# Patient Record
Sex: Female | Born: 1965 | Race: White | Hispanic: No | Marital: Married | Smoking: Never smoker
Health system: Southern US, Community
[De-identification: ages and names within clinical notes are randomized; demographics above are authoritative.]

## PROBLEM LIST (undated history)

## (undated) DIAGNOSIS — E119 Type 2 diabetes mellitus without complications: Secondary | ICD-10-CM

## (undated) DIAGNOSIS — E041 Nontoxic single thyroid nodule: Secondary | ICD-10-CM

## (undated) DIAGNOSIS — R002 Palpitations: Secondary | ICD-10-CM

## (undated) DIAGNOSIS — E039 Hypothyroidism, unspecified: Secondary | ICD-10-CM

## (undated) DIAGNOSIS — E063 Autoimmune thyroiditis: Secondary | ICD-10-CM

## (undated) DIAGNOSIS — N762 Acute vulvitis: Secondary | ICD-10-CM

## (undated) DIAGNOSIS — R87610 Atypical squamous cells of undetermined significance on cytologic smear of cervix (ASC-US): Secondary | ICD-10-CM

## (undated) DIAGNOSIS — B009 Herpesviral infection, unspecified: Secondary | ICD-10-CM

## (undated) DIAGNOSIS — F329 Major depressive disorder, single episode, unspecified: Secondary | ICD-10-CM

## (undated) DIAGNOSIS — Z800 Family history of malignant neoplasm of digestive organs: Secondary | ICD-10-CM

## (undated) DIAGNOSIS — N898 Other specified noninflammatory disorders of vagina: Secondary | ICD-10-CM

## (undated) DIAGNOSIS — R112 Nausea with vomiting, unspecified: Secondary | ICD-10-CM

## (undated) DIAGNOSIS — R011 Cardiac murmur, unspecified: Secondary | ICD-10-CM

## (undated) DIAGNOSIS — F32A Depression, unspecified: Secondary | ICD-10-CM

## (undated) DIAGNOSIS — Z8249 Family history of ischemic heart disease and other diseases of the circulatory system: Secondary | ICD-10-CM

## (undated) DIAGNOSIS — Z8042 Family history of malignant neoplasm of prostate: Secondary | ICD-10-CM

## (undated) DIAGNOSIS — F419 Anxiety disorder, unspecified: Secondary | ICD-10-CM

## (undated) DIAGNOSIS — Z8041 Family history of malignant neoplasm of ovary: Secondary | ICD-10-CM

## (undated) DIAGNOSIS — I10 Essential (primary) hypertension: Secondary | ICD-10-CM

## (undated) DIAGNOSIS — Z803 Family history of malignant neoplasm of breast: Secondary | ICD-10-CM

## (undated) DIAGNOSIS — E785 Hyperlipidemia, unspecified: Secondary | ICD-10-CM

## (undated) HISTORY — DX: Other specified noninflammatory disorders of vagina: N89.8

## (undated) HISTORY — DX: Palpitations: R00.2

## (undated) HISTORY — DX: Autoimmune thyroiditis: E06.3

## (undated) HISTORY — DX: Anxiety disorder, unspecified: F41.9

## (undated) HISTORY — DX: Major depressive disorder, single episode, unspecified: F32.9

## (undated) HISTORY — DX: Type 2 diabetes mellitus without complications: E11.9

## (undated) HISTORY — DX: Family history of malignant neoplasm of prostate: Z80.42

## (undated) HISTORY — DX: Cardiac murmur, unspecified: R01.1

## (undated) HISTORY — DX: Hyperlipidemia, unspecified: E78.5

## (undated) HISTORY — DX: Acute vulvitis: N76.2

## (undated) HISTORY — DX: Atypical squamous cells of undetermined significance on cytologic smear of cervix (ASC-US): R87.610

## (undated) HISTORY — DX: Herpesviral infection, unspecified: B00.9

## (undated) HISTORY — DX: Nontoxic single thyroid nodule: E04.1

## (undated) HISTORY — DX: Family history of ischemic heart disease and other diseases of the circulatory system: Z82.49

## (undated) HISTORY — DX: Family history of malignant neoplasm of digestive organs: Z80.0

## (undated) HISTORY — DX: Depression, unspecified: F32.A

## (undated) HISTORY — PX: AUGMENTATION MAMMAPLASTY: SUR837

## (undated) HISTORY — DX: Family history of malignant neoplasm of breast: Z80.3

## (undated) HISTORY — DX: Family history of malignant neoplasm of ovary: Z80.41

---

## 1998-08-05 ENCOUNTER — Other Ambulatory Visit: Admission: RE | Admit: 1998-08-05 | Discharge: 1998-08-05 | Payer: Self-pay | Admitting: Obstetrics and Gynecology

## 2000-08-08 ENCOUNTER — Other Ambulatory Visit: Admission: RE | Admit: 2000-08-08 | Discharge: 2000-08-08 | Payer: Self-pay | Admitting: Obstetrics and Gynecology

## 2000-08-09 ENCOUNTER — Encounter: Payer: Self-pay | Admitting: Obstetrics and Gynecology

## 2000-08-09 ENCOUNTER — Encounter: Admission: RE | Admit: 2000-08-09 | Discharge: 2000-08-09 | Payer: Self-pay | Admitting: Obstetrics and Gynecology

## 2003-04-01 ENCOUNTER — Other Ambulatory Visit: Admission: RE | Admit: 2003-04-01 | Discharge: 2003-04-01 | Payer: Self-pay | Admitting: Obstetrics and Gynecology

## 2003-06-12 ENCOUNTER — Encounter (INDEPENDENT_AMBULATORY_CARE_PROVIDER_SITE_OTHER): Payer: Self-pay | Admitting: Specialist

## 2003-06-12 ENCOUNTER — Ambulatory Visit (HOSPITAL_BASED_OUTPATIENT_CLINIC_OR_DEPARTMENT_OTHER): Admission: RE | Admit: 2003-06-12 | Discharge: 2003-06-12 | Payer: Self-pay | Admitting: Plastic Surgery

## 2003-06-12 ENCOUNTER — Ambulatory Visit (HOSPITAL_COMMUNITY): Admission: RE | Admit: 2003-06-12 | Discharge: 2003-06-12 | Payer: Self-pay | Admitting: Plastic Surgery

## 2004-09-09 ENCOUNTER — Encounter: Admission: RE | Admit: 2004-09-09 | Discharge: 2004-09-09 | Payer: Self-pay | Admitting: Obstetrics and Gynecology

## 2004-09-23 ENCOUNTER — Encounter: Admission: RE | Admit: 2004-09-23 | Discharge: 2004-09-23 | Payer: Self-pay | Admitting: Obstetrics and Gynecology

## 2005-03-22 ENCOUNTER — Encounter: Admission: RE | Admit: 2005-03-22 | Discharge: 2005-03-22 | Payer: Self-pay | Admitting: Obstetrics and Gynecology

## 2005-09-14 ENCOUNTER — Encounter: Admission: RE | Admit: 2005-09-14 | Discharge: 2005-09-14 | Payer: Self-pay | Admitting: Obstetrics and Gynecology

## 2006-08-07 ENCOUNTER — Encounter: Admission: RE | Admit: 2006-08-07 | Discharge: 2006-11-05 | Payer: Self-pay | Admitting: Obstetrics and Gynecology

## 2006-10-11 ENCOUNTER — Inpatient Hospital Stay (HOSPITAL_COMMUNITY): Admission: AD | Admit: 2006-10-11 | Discharge: 2006-10-13 | Payer: Self-pay | Admitting: Obstetrics and Gynecology

## 2006-10-15 ENCOUNTER — Encounter: Admission: RE | Admit: 2006-10-15 | Discharge: 2006-10-23 | Payer: Self-pay | Admitting: Obstetrics and Gynecology

## 2007-08-30 ENCOUNTER — Encounter: Admission: RE | Admit: 2007-08-30 | Discharge: 2007-08-30 | Payer: Self-pay | Admitting: Obstetrics and Gynecology

## 2008-03-26 ENCOUNTER — Encounter: Admission: RE | Admit: 2008-03-26 | Discharge: 2008-03-26 | Payer: Self-pay | Admitting: Endocrinology

## 2008-08-12 ENCOUNTER — Encounter: Admission: RE | Admit: 2008-08-12 | Discharge: 2008-08-12 | Payer: Self-pay | Admitting: Endocrinology

## 2008-09-10 ENCOUNTER — Encounter: Admission: RE | Admit: 2008-09-10 | Discharge: 2008-09-10 | Payer: Self-pay | Admitting: Family Medicine

## 2009-04-07 ENCOUNTER — Encounter: Admission: RE | Admit: 2009-04-07 | Discharge: 2009-04-07 | Payer: Self-pay | Admitting: Endocrinology

## 2009-09-23 ENCOUNTER — Encounter: Admission: RE | Admit: 2009-09-23 | Discharge: 2009-09-23 | Payer: Self-pay | Admitting: Endocrinology

## 2009-11-24 ENCOUNTER — Encounter: Admission: RE | Admit: 2009-11-24 | Discharge: 2009-11-24 | Payer: Self-pay | Admitting: Obstetrics and Gynecology

## 2010-07-11 ENCOUNTER — Encounter: Payer: Self-pay | Admitting: Obstetrics and Gynecology

## 2010-08-11 ENCOUNTER — Other Ambulatory Visit: Payer: Self-pay | Admitting: Endocrinology

## 2010-08-12 ENCOUNTER — Other Ambulatory Visit: Payer: Self-pay | Admitting: Endocrinology

## 2010-08-13 ENCOUNTER — Other Ambulatory Visit: Payer: Self-pay | Admitting: Endocrinology

## 2010-08-13 DIAGNOSIS — E049 Nontoxic goiter, unspecified: Secondary | ICD-10-CM

## 2010-08-18 ENCOUNTER — Ambulatory Visit
Admission: RE | Admit: 2010-08-18 | Discharge: 2010-08-18 | Disposition: A | Discharge status: Home / Self Care | Payer: BC Managed Care – PPO | Source: Ambulatory Visit | Attending: Endocrinology | Admitting: Endocrinology

## 2010-08-18 DIAGNOSIS — E049 Nontoxic goiter, unspecified: Secondary | ICD-10-CM

## 2010-11-05 NOTE — Op Note (Signed)
Laura Marshall, Laura Marshall                           ACCOUNT NO.:  [id number]   MEDICAL RECORD NO.:  [id number]                   PATIENT TYPE:  AMB   LOCATION:  DSC                                  FACILITY:  [facility]   PHYSICIAN:  Etter Sjogren, M.D.                  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   POSTOPERATIVE DIAGNOSES:  1. Possible early evolving melanoma of the anterior chest, greater than 1     cm.  2. Complicated open wound of the anterior chest greater than 2.5 cm.   PROCEDURE:  1. Excision of lesion of undetermined behavior, possibly early evolving at     greater than 1.0 cm, chest.  2. Complex wound closure anterior chest greater than 2.5 cm.   SURGEON:  Etter Sjogren, M.D.   ANESTHESIA:  1% Xylocaine with epinephrine plus bicarbonate.   INDICATIONS FOR PROCEDURE:  The patient is a clinical 45 year old woman who  had a lesion  on her anterior chest, mid sternal region, excised by her  dermatologist. This proved to be severely dysplastic, probable evolving  melanoma. This was referred for a wider excision. The nature of the  procedure and the risks were discussed with her and she understood the  procedure and wished to proceed. She understood the possibility of need for  further surgery depending upon the final pathology result. She also  understood that this is a very difficult area for scarring and that there  may things that need to be done with her scar over the long-term.   DESCRIPTION OF PROCEDURE:  The patient was placed in the supine position.  The excision was marked with generous borders around the previous biopsy  site. This was oriented in the vertical direction since it was essentially  mid sternal. She was prepped with Betadine and draped with sterile drapes.  Satisfactory local anesthesia was achieved.   The excision was performed. The specimen was tagged on its right lateral  border. Thorough irrigation and excellent hemostasis was confirmed. The  layered closure with  3-0 interrupted Monocryl for the deep sutures, 4-0 PDS interrupted inverted  subcutaneous and deep dermal sutures and a running 4-0 PDS subcuticular  suture. A dry sterile dressing was applied.   The patient tolerated the procedure well. She will be seen in recheck next  week.                                               Etter Sjogren, M.D.    DB/MEDQ  D:  06/12/2003  T:  06/14/2003  Job:  [id number] 

## 2010-11-05 NOTE — H&P (Signed)
NAMEMARIE, Laura Marshall             ACCOUNT NO.:  [id number]   MEDICAL RECORD NO.:  [id number]          PATIENT TYPE:  INP   LOCATION:  9174                          FACILITY:  [facility]   PHYSICIAN:  Lenoard Aden, M.D.DATE OF BIRTH:  1966/05/21   DATE OF ADMISSION:  10/11/2006  DATE OF DISCHARGE:                              HISTORY & PHYSICAL   INDICATIONS FOR INDUCTION:  Class A2 diabetes mellitus, presumed large-  for-gestational age fetus.  GBS positive for induction.  She is a 45-  year-old white female, G2, P1, at [redacted] weeks gestation for aforementioned  reasons for induction.  She has a history of an abnormal triple screen  with a risk of Down's syndrome of 1 in 36 and declined amniocentesis.  She is a nonsmoker, nondrinker.  She denies domestic or physical  violence.   Medications include glyburide and prenatal vitamins.   FAMILY HISTORY:  Remarkable for leiomyoma sarcoma, uterine cancer,  Down's syndrome, heart disease, myocardial infarction, stomach cancer,  diabetes, ovarian cancer.   Her personal history was remarkable for HPV, breast augmentation, and  Hashimoto's thyroiditis.  Pregnancy course otherwise uncomplicated.  She  is a T4, P2 with a history of vaginal delivery as noted and history of  SAB with D&C x1.   PHYSICAL EXAMINATION:  GENERAL:  She is a well-developed and well-  nourished white female in no acute distress.  HEENT:  Normal.  LUNGS:  Clear.  HEART:  Regular rhythm.  ABDOMEN:  Soft, gravid, nontender.  Estimated fetal weight 7-1/2 pounds.  PELVIC:  Cervix is 4 cm, 80% vertex, 0 station.  EXTREMITIES:  No cords.  NEUROLOGIC:  Nonfocal.  SKIN:  Intact.   NST is reactive.   IMPRESSION:  1. A 38-week obstetrics.  2. Class A2 diabetes mellitus, for induction.   PLAN:  Proceed with Pitocin for cervical ripening and induction.  GBS  prophylaxis ordered.  Epidural as needed.     Lenoard Aden, M.D.  Electronically Signed    RJT/MEDQ  D:   10/11/2006  T:  10/12/2006  Job:  [id number]

## 2010-11-23 ENCOUNTER — Other Ambulatory Visit: Payer: Self-pay | Admitting: Obstetrics and Gynecology

## 2010-11-23 DIAGNOSIS — Z1231 Encounter for screening mammogram for malignant neoplasm of breast: Secondary | ICD-10-CM

## 2010-12-02 ENCOUNTER — Ambulatory Visit: Payer: BC Managed Care – PPO

## 2010-12-15 ENCOUNTER — Ambulatory Visit
Admission: RE | Admit: 2010-12-15 | Discharge: 2010-12-15 | Disposition: A | Discharge status: Home / Self Care | Payer: BC Managed Care – PPO | Source: Ambulatory Visit | Attending: Obstetrics and Gynecology | Admitting: Obstetrics and Gynecology

## 2010-12-15 DIAGNOSIS — Z1231 Encounter for screening mammogram for malignant neoplasm of breast: Secondary | ICD-10-CM

## 2011-04-05 IMAGING — US US SOFT TISSUE HEAD/NECK
1 series · 14 of 25 positions shown · non-contrast
Comparison: 09/23/2009

CLINICAL DATA: Goiter

THYROID ULTRASOUND
TECHNIQUE: Ultrasound examination of the thyroid gland and
adjacent soft tissues was performed.

[Series 1: us soft tissue head/neck · 0.07mm/px · 14 of 33 slices shown]
[im 1/33]
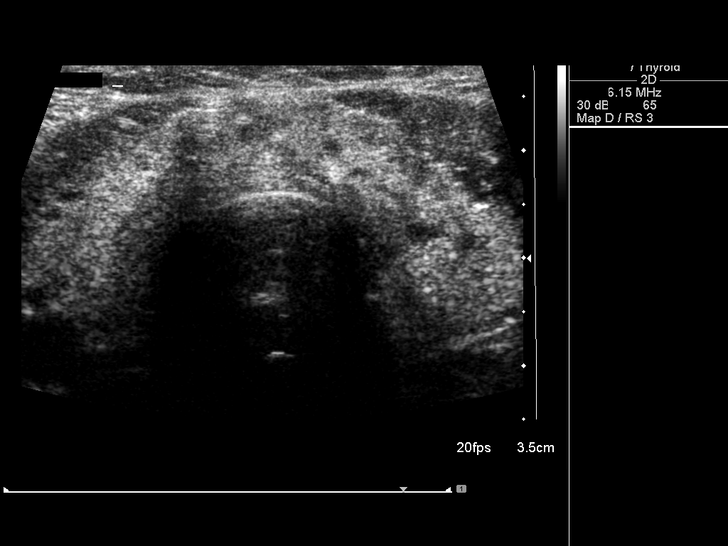
[im 3/33]
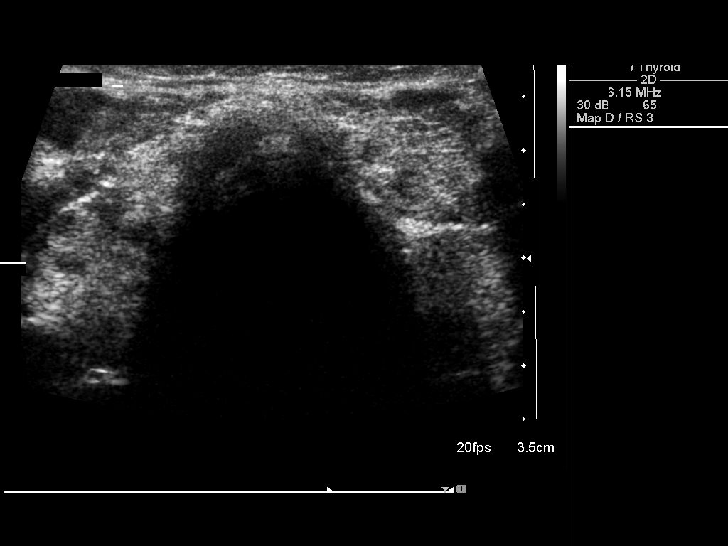
[im 6/33]
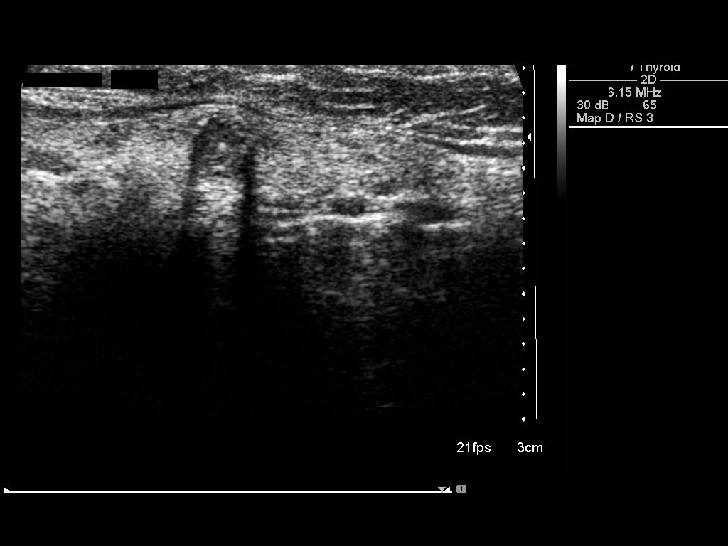
[im 9/33]
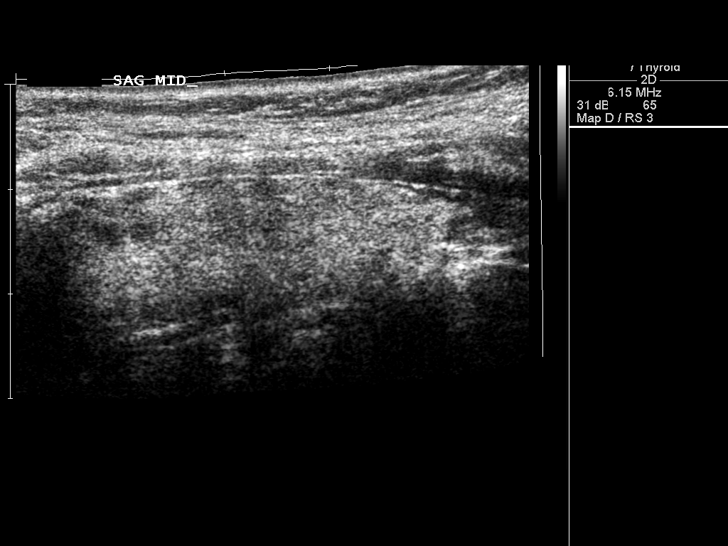
[im 11/33]
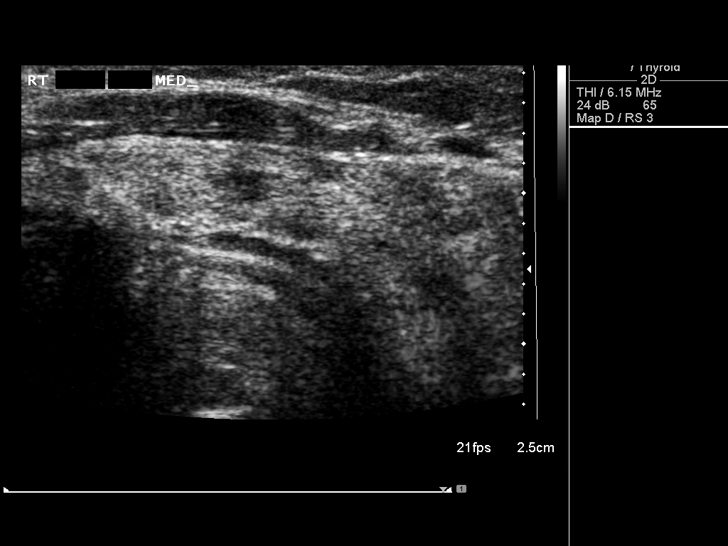
[im 13/33]
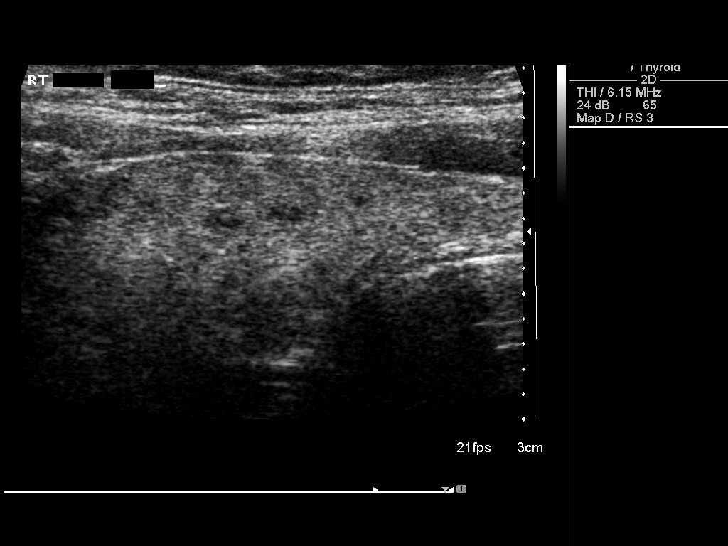
[im 15/33]
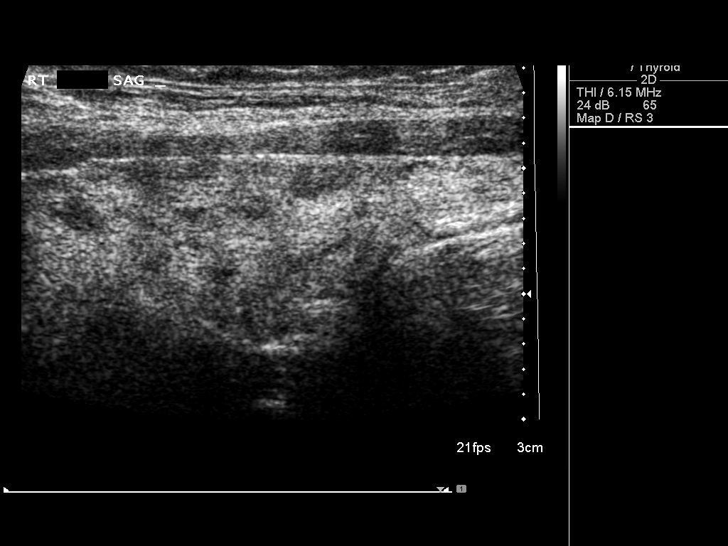
[im 18/33]
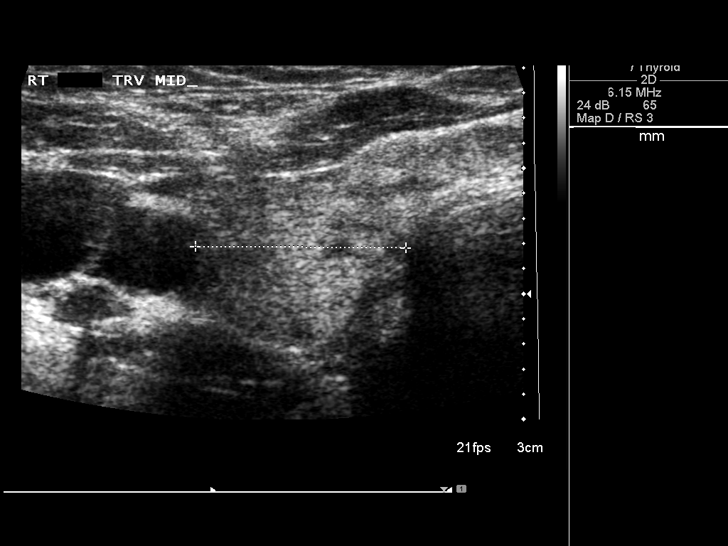
[im 21/33]
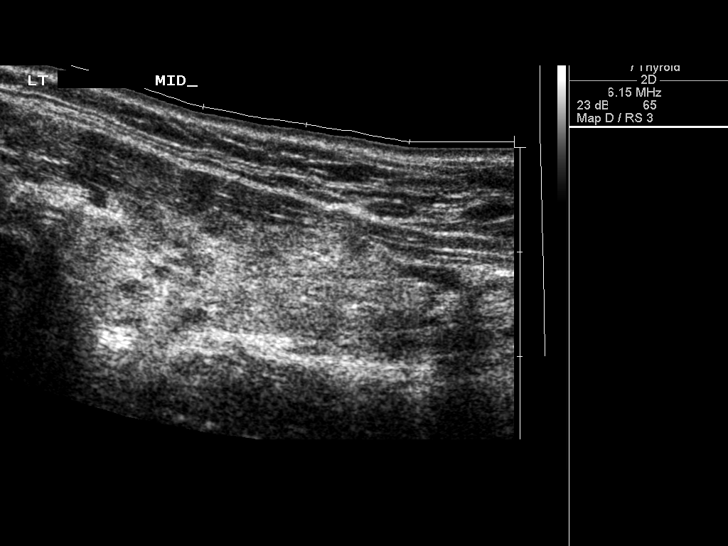
[im 22/33]
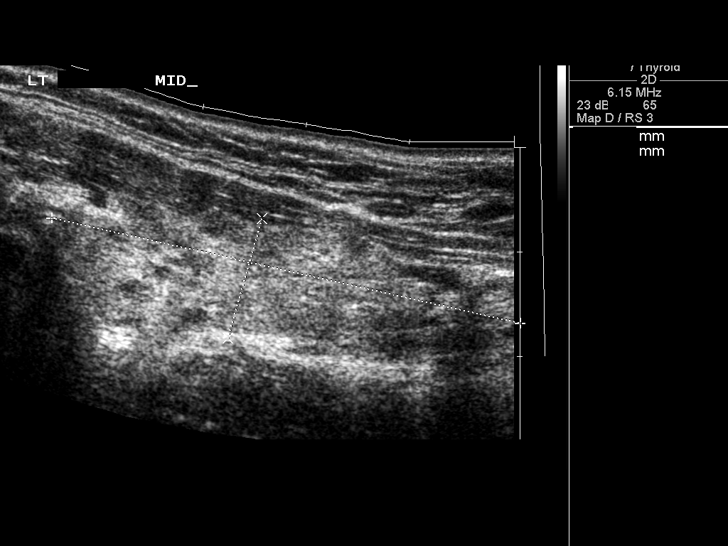
[im 25/33]
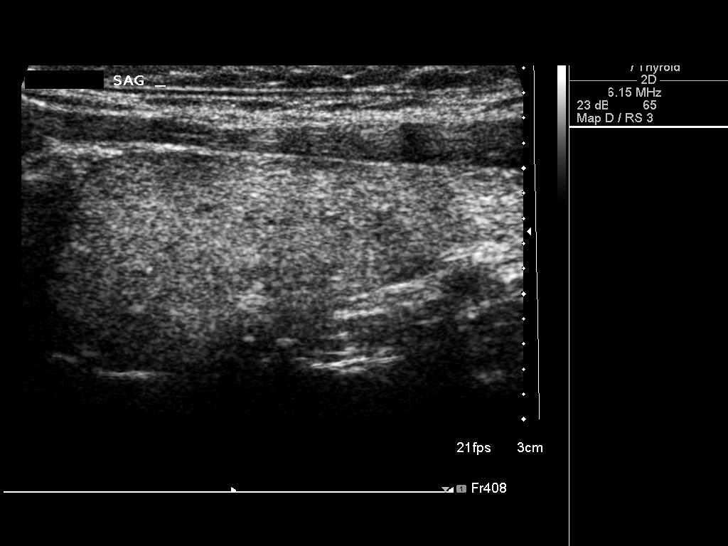
[im 27/33]
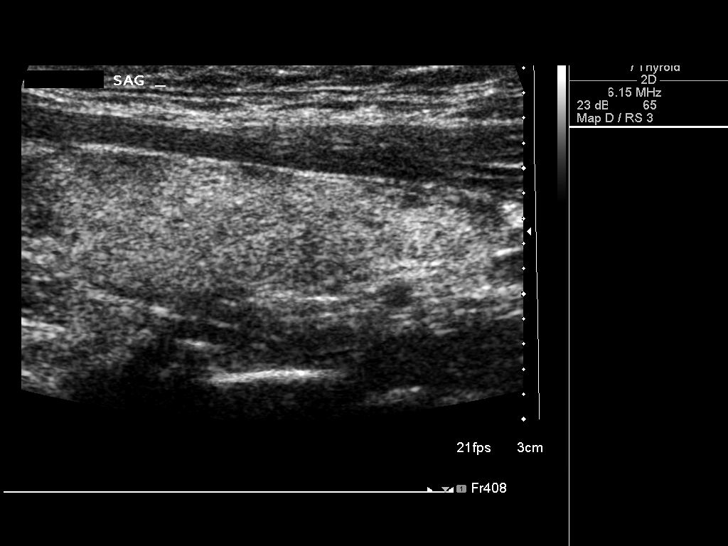
[im 30/33]
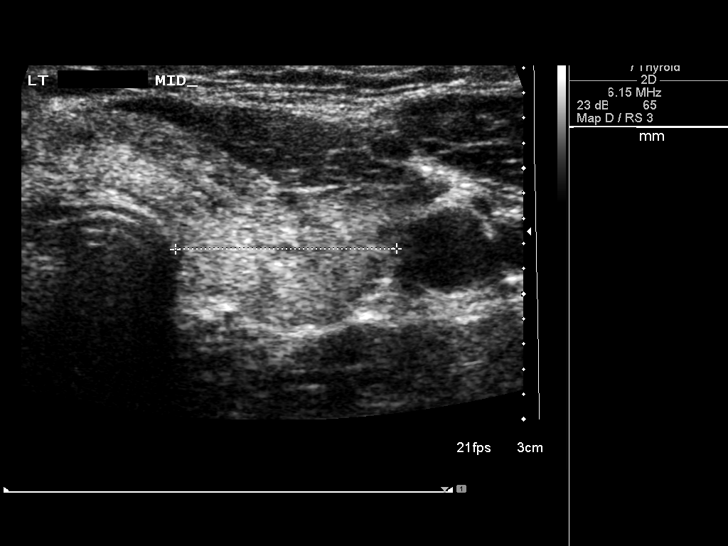
[im 33/33]
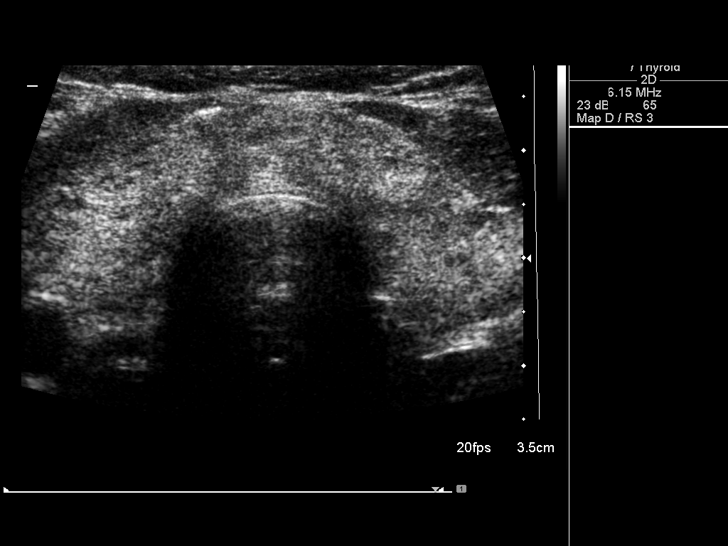

[14 of 25 positions shown; findings below may reference images not displayed]

FINDINGS: The right thyroid lobe measures 4.7 x 1.5 x 1.7 cm.  The left lobe
measures 4.6 x 1.2 x 1.8 cm.  The isthmus is 0.7 mm in thickness.
Thyroid parenchyma is diffusely heterogeneous.

5 x 5 x 7 mm solid nodule is identified in the right aspect of the
isthmus.  This contains an internal calcification.  The same nodule
measured 5 x 6 x 7 mm previously.
IMPRESSION: No interval change in the 7 mm nodule in the isthmus.

## 2011-11-21 ENCOUNTER — Other Ambulatory Visit: Payer: Self-pay | Admitting: Obstetrics and Gynecology

## 2011-11-21 DIAGNOSIS — Z1231 Encounter for screening mammogram for malignant neoplasm of breast: Secondary | ICD-10-CM

## 2011-12-27 ENCOUNTER — Ambulatory Visit
Admission: RE | Admit: 2011-12-27 | Discharge: 2011-12-27 | Disposition: A | Discharge status: Home / Self Care | Payer: BC Managed Care – PPO | Source: Ambulatory Visit | Attending: Obstetrics and Gynecology | Admitting: Obstetrics and Gynecology

## 2011-12-27 DIAGNOSIS — Z1231 Encounter for screening mammogram for malignant neoplasm of breast: Secondary | ICD-10-CM

## 2012-02-29 ENCOUNTER — Other Ambulatory Visit: Payer: Self-pay | Admitting: Family Medicine

## 2012-02-29 DIAGNOSIS — Z9882 Breast implant status: Secondary | ICD-10-CM

## 2012-02-29 DIAGNOSIS — N644 Mastodynia: Secondary | ICD-10-CM

## 2012-03-01 ENCOUNTER — Ambulatory Visit
Admission: RE | Admit: 2012-03-01 | Discharge: 2012-03-01 | Disposition: A | Discharge status: Home / Self Care | Payer: BC Managed Care – PPO | Source: Ambulatory Visit | Attending: Family Medicine | Admitting: Family Medicine

## 2012-03-01 ENCOUNTER — Other Ambulatory Visit: Payer: Self-pay | Admitting: Family Medicine

## 2012-03-01 DIAGNOSIS — Z9882 Breast implant status: Secondary | ICD-10-CM

## 2012-03-01 DIAGNOSIS — N644 Mastodynia: Secondary | ICD-10-CM

## 2012-04-23 ENCOUNTER — Other Ambulatory Visit: Payer: Self-pay | Admitting: Endocrinology

## 2012-04-23 DIAGNOSIS — E049 Nontoxic goiter, unspecified: Secondary | ICD-10-CM

## 2012-05-02 ENCOUNTER — Ambulatory Visit
Admission: RE | Admit: 2012-05-02 | Discharge: 2012-05-02 | Disposition: A | Discharge status: Home / Self Care | Payer: BC Managed Care – PPO | Source: Ambulatory Visit | Attending: Endocrinology | Admitting: Endocrinology

## 2012-05-02 DIAGNOSIS — E049 Nontoxic goiter, unspecified: Secondary | ICD-10-CM

## 2012-10-22 ENCOUNTER — Other Ambulatory Visit: Payer: Self-pay | Admitting: Endocrinology

## 2012-10-22 DIAGNOSIS — E049 Nontoxic goiter, unspecified: Secondary | ICD-10-CM

## 2012-11-05 ENCOUNTER — Ambulatory Visit
Admission: RE | Admit: 2012-11-05 | Discharge: 2012-11-05 | Disposition: A | Discharge status: Home / Self Care | Payer: BC Managed Care – PPO | Source: Ambulatory Visit | Attending: Endocrinology | Admitting: Endocrinology

## 2012-11-05 DIAGNOSIS — E049 Nontoxic goiter, unspecified: Secondary | ICD-10-CM

## 2013-01-07 ENCOUNTER — Other Ambulatory Visit: Payer: Self-pay

## 2013-01-07 DIAGNOSIS — Z1231 Encounter for screening mammogram for malignant neoplasm of breast: Secondary | ICD-10-CM

## 2013-04-12 ENCOUNTER — Ambulatory Visit
Admission: RE | Admit: 2013-04-12 | Discharge: 2013-04-12 | Disposition: A | Discharge status: Home / Self Care | Payer: BC Managed Care – PPO | Source: Ambulatory Visit

## 2013-04-12 DIAGNOSIS — Z1231 Encounter for screening mammogram for malignant neoplasm of breast: Secondary | ICD-10-CM

## 2013-07-20 ENCOUNTER — Encounter: Payer: Self-pay | Admitting: *Deleted

## 2013-07-20 ENCOUNTER — Encounter: Payer: Self-pay | Admitting: Cardiology

## 2013-07-20 DIAGNOSIS — F329 Major depressive disorder, single episode, unspecified: Secondary | ICD-10-CM | POA: Insufficient documentation

## 2013-07-20 DIAGNOSIS — F419 Anxiety disorder, unspecified: Secondary | ICD-10-CM | POA: Insufficient documentation

## 2013-07-20 DIAGNOSIS — E119 Type 2 diabetes mellitus without complications: Secondary | ICD-10-CM | POA: Insufficient documentation

## 2013-07-20 DIAGNOSIS — E041 Nontoxic single thyroid nodule: Secondary | ICD-10-CM | POA: Insufficient documentation

## 2013-07-20 DIAGNOSIS — R011 Cardiac murmur, unspecified: Secondary | ICD-10-CM | POA: Insufficient documentation

## 2013-07-20 DIAGNOSIS — F32A Depression, unspecified: Secondary | ICD-10-CM | POA: Insufficient documentation

## 2013-07-20 DIAGNOSIS — E785 Hyperlipidemia, unspecified: Secondary | ICD-10-CM | POA: Insufficient documentation

## 2014-03-06 ENCOUNTER — Other Ambulatory Visit: Payer: Self-pay | Admitting: Endocrinology

## 2014-03-06 DIAGNOSIS — E049 Nontoxic goiter, unspecified: Secondary | ICD-10-CM

## 2014-03-18 ENCOUNTER — Other Ambulatory Visit: Payer: Self-pay

## 2014-03-18 DIAGNOSIS — Z1231 Encounter for screening mammogram for malignant neoplasm of breast: Secondary | ICD-10-CM

## 2014-04-15 ENCOUNTER — Ambulatory Visit
Admission: RE | Admit: 2014-04-15 | Discharge: 2014-04-15 | Disposition: A | Discharge status: Home / Self Care | Payer: BC Managed Care – PPO | Source: Ambulatory Visit

## 2014-04-15 DIAGNOSIS — Z1231 Encounter for screening mammogram for malignant neoplasm of breast: Secondary | ICD-10-CM

## 2014-05-07 ENCOUNTER — Other Ambulatory Visit: Payer: Self-pay | Admitting: Family Medicine

## 2014-05-07 ENCOUNTER — Ambulatory Visit
Admission: RE | Admit: 2014-05-07 | Discharge: 2014-05-07 | Disposition: A | Discharge status: Home / Self Care | Payer: No Typology Code available for payment source | Source: Ambulatory Visit | Attending: Family Medicine | Admitting: Family Medicine

## 2014-05-07 DIAGNOSIS — R1031 Right lower quadrant pain: Secondary | ICD-10-CM

## 2014-05-07 MED ORDER — IOHEXOL 300 MG/ML  SOLN
100.0000 mL | Freq: Once | INTRAMUSCULAR | Status: AC | PRN
  Administered 2014-05-07: 100 mL via INTRAVENOUS

## 2015-02-25 ENCOUNTER — Other Ambulatory Visit: Payer: BC Managed Care – PPO

## 2015-02-27 ENCOUNTER — Ambulatory Visit
Admission: RE | Admit: 2015-02-27 | Discharge: 2015-02-27 | Disposition: A | Discharge status: Home / Self Care | Payer: BLUE CROSS/BLUE SHIELD | Source: Ambulatory Visit | Attending: Endocrinology | Admitting: Endocrinology

## 2015-02-27 DIAGNOSIS — E049 Nontoxic goiter, unspecified: Secondary | ICD-10-CM

## 2015-03-18 ENCOUNTER — Other Ambulatory Visit: Payer: Self-pay

## 2015-03-18 DIAGNOSIS — Z1231 Encounter for screening mammogram for malignant neoplasm of breast: Secondary | ICD-10-CM

## 2015-04-17 ENCOUNTER — Ambulatory Visit
Admission: RE | Admit: 2015-04-17 | Discharge: 2015-04-17 | Disposition: A | Discharge status: Home / Self Care | Payer: BLUE CROSS/BLUE SHIELD | Source: Ambulatory Visit

## 2015-04-17 DIAGNOSIS — Z1231 Encounter for screening mammogram for malignant neoplasm of breast: Secondary | ICD-10-CM

## 2015-10-09 DIAGNOSIS — I10 Essential (primary) hypertension: Secondary | ICD-10-CM | POA: Diagnosis not present

## 2016-01-04 DIAGNOSIS — N915 Oligomenorrhea, unspecified: Secondary | ICD-10-CM | POA: Diagnosis not present

## 2016-01-04 DIAGNOSIS — Z01419 Encounter for gynecological examination (general) (routine) without abnormal findings: Secondary | ICD-10-CM | POA: Diagnosis not present

## 2016-01-04 DIAGNOSIS — B977 Papillomavirus as the cause of diseases classified elsewhere: Secondary | ICD-10-CM | POA: Diagnosis not present

## 2016-01-04 DIAGNOSIS — Z6824 Body mass index (BMI) 24.0-24.9, adult: Secondary | ICD-10-CM | POA: Diagnosis not present

## 2016-01-04 DIAGNOSIS — R87610 Atypical squamous cells of undetermined significance on cytologic smear of cervix (ASC-US): Secondary | ICD-10-CM | POA: Diagnosis not present

## 2016-01-07 DIAGNOSIS — E785 Hyperlipidemia, unspecified: Secondary | ICD-10-CM | POA: Diagnosis not present

## 2016-01-07 DIAGNOSIS — I10 Essential (primary) hypertension: Secondary | ICD-10-CM | POA: Diagnosis not present

## 2016-01-07 DIAGNOSIS — Z803 Family history of malignant neoplasm of breast: Secondary | ICD-10-CM | POA: Diagnosis not present

## 2016-01-07 DIAGNOSIS — Z119 Encounter for screening for infectious and parasitic diseases, unspecified: Secondary | ICD-10-CM | POA: Diagnosis not present

## 2016-02-03 ENCOUNTER — Encounter: Payer: Self-pay | Admitting: Genetic Counselor

## 2016-02-03 ENCOUNTER — Ambulatory Visit (HOSPITAL_BASED_OUTPATIENT_CLINIC_OR_DEPARTMENT_OTHER): Payer: BLUE CROSS/BLUE SHIELD | Admitting: Genetic Counselor

## 2016-02-03 ENCOUNTER — Other Ambulatory Visit: Payer: BLUE CROSS/BLUE SHIELD

## 2016-02-03 DIAGNOSIS — Z315 Encounter for genetic counseling: Secondary | ICD-10-CM

## 2016-02-03 DIAGNOSIS — Z800 Family history of malignant neoplasm of digestive organs: Secondary | ICD-10-CM | POA: Diagnosis not present

## 2016-02-03 DIAGNOSIS — Z803 Family history of malignant neoplasm of breast: Secondary | ICD-10-CM

## 2016-02-03 DIAGNOSIS — Z8041 Family history of malignant neoplasm of ovary: Secondary | ICD-10-CM | POA: Insufficient documentation

## 2016-02-03 DIAGNOSIS — Z8042 Family history of malignant neoplasm of prostate: Secondary | ICD-10-CM

## 2016-02-03 NOTE — Progress Notes (Signed)
REFERRING PROVIDER: Lanice Shirts, MD [address] [place], [place] [address]   Brien Few, MD  PRIMARY PROVIDER:  Kelton Pillar, MD  PRIMARY REASON FOR VISIT:  1. Family history of breast cancer   2. Family history of ovarian cancer   3. Family history of prostate cancer   4. Family history of stomach cancer   5. Family history of colon cancer      HISTORY OF PRESENT ILLNESS:   Ms. Papadopoulos, a 50 y.o. female, was seen for a [place] cancer genetics consultation at the request of Dr. Coralyn Mark due to a family history of cancer.  Ms. Glaus presents to clinic today to discuss the possibility of a hereditary predisposition to cancer, genetic testing, and to further clarify her future cancer risks, as well as potential cancer risks for family members. Ms. Marsan is a 50 y.o. female with no personal history of cancer.  Her maternal aunt was tested at [facility] in 2009 for BRCA1 and BRCA2 mutations.  She was positive for a BRCA2 mutation called 1684C>T).  To date, patient is unaware of other family members who have been tested.  CANCER HISTORY:   No history exists.     HORMONAL RISK FACTORS:  Menarche was at age 52.  First live birth at age 41.  OCP use for approximately 29 years.  Ovaries intact: yes.  Hysterectomy: no.  Menopausal status: perimenopausal.  HRT use: 0 years. Colonoscopy: yes; several benign polyps.  To be seen in 10 years.. Mammogram within the last year: yes. Number of breast biopsies: 0. Up to date with pelvic exams:  yes. Any excessive radiation exposure in the past:  no  Past Medical History:  Diagnosis Date  . Anxiety   . Depression   . Family history of breast cancer   . Family history of colon cancer   . Family history of ovarian cancer   . Family history of prostate cancer   . Family history of stomach cancer   . Heart murmur   . Hyperlipidemia   . Thyroid nodule   . Type 2 diabetes mellitus ([place])      History reviewed. No pertinent surgical history.  Social History   Social History  . Marital status: Married    Spouse name: N/A  . Number of children: N/A  . Years of education: N/A   Social History Main Topics  . Smoking status: Never Smoker  . Smokeless tobacco: Never Used  . Alcohol use 1.2 - 1.8 oz/week    2 - 3 Glasses of wine per week  . Drug use: No  . Sexual activity: Not Asked   Other Topics Concern  . None   Social History Narrative  . None     FAMILY HISTORY:  We obtained a detailed, 4-generation family history.  Significant diagnoses are listed below: Family History  Problem Relation Age of Onset  . CAD    . Cancer Mother 29    leiomyosarcoma  . Prostate cancer Father 35  . Congenital heart disease Maternal Aunt     died in surgery at 46  . Liver cancer Paternal Aunt     probably not primary cancer  . Stomach cancer Maternal Grandmother 87  . CAD Maternal Grandfather   . Breast cancer Paternal Grandmother     dx in her 46s, died at 19  . Ovarian cancer Maternal Aunt 60  . Cancer Paternal Aunt     NOS  . Diabetes Paternal  Aunt   . Breast cancer Other 29    MGMs sister  . Colon cancer Other     MGMs brother  . Breast cancer Other     MGMs mother  . Breast cancer Other 68    MGMs maternal aunt  . Breast cancer Cousin     3 of patient's mother's first cousins    The patient has two daughters ages 31 and 79 who are cancer free.  She has two brothers who are also cancer free.  The patient's mother died of a leiomyosarcoma at age 65.  She had two sisters (twins) and a brother.  One sister died at 2 during heart surgery and the other died of ovarian cancer.  This sister tested positive for a BRCA2 mutation, as did her daughter.  The brother is cancer free.  The patient's maternal grandmother died of stomach cancer.  She had a brother and sister.  The sister had breast cancer at 22 and had a daughter who had breast cancer at 19.  The brother had  colon cancer and had three daughters, two who had breast cancer and the third with multiple myeloma.  The patient's maternal great-grandmother had breast cancer as did her sister who was diagnosed at 45 and had a daughter with colon cancer.    The patient's father was diagnosed with prostate cancer at 40.  He is currently 28.  He had three sisters.  One who had liver cancer that was probably not the primary cancer, and another who had a NOS cancer.  The patient's maternal grandmother died at 24 from breast cancer.  Patient's maternal ancestors are of [place] and [place] descent, and paternal ancestors are of [place] descent. There is no reported Ashkenazi Jewish ancestry. There is no known consanguinity.  GENETIC COUNSELING ASSESSMENT: LARONDA LISBY is a 50 y.o. female with a family history of breast, ovarian, colon and prostate cancer in the face of a known family mutation which is somewhat suggestive of a hereditary cancer syndrome and predisposition to cancer. We, therefore, discussed and recommended the following at today's visit.   DISCUSSION: We discussed that about 5-10% of breast cancer and about 15-20% of ovarian cancer is due to hereditary causes, most commonly BRCA mutations.  The patient's cousin has a KFM in [place].  The patient's mother died of leiomyosarcoma, which is not a typical BRCA cancer. The patient has approximately a 25% chance of testing positive for the [facility] found in her maternal aunt.  We discussed that Ms. Twaddell has bi lineal risk, as her paternal grandmother was diagnosed with breast cancer in her 6's and her father has prostate cancer.  About 3-6% of patient's will have more than one hereditary mutation.  Because there are several cancers in the maternal side that are not typically found in BRCA families (colon cancer, multiple myeloma, gastric cancer and leiomyosarcoma) and because there is a risk coming from the paternal side of the family, we suggest undergoing genetic  testing through a panel.  We discussed several options, including utilizing [facility]'s panel.  We reviewed the characteristics, features and inheritance patterns of hereditary cancer syndromes. We also discussed genetic testing, including the appropriate family members to test, the process of testing, insurance coverage and turn-around-time for results. We discussed the implications of a negative, positive and/or variant of uncertain significant result. We recommended Ms. Acuna pursue genetic testing for the [facility] 30-gene panel. The 30-Gene Cancer Panel offered by [facility] includes sequencing and/or deletion  duplication testing of the following 30 genes: APC, ATM, BAP1, BARD1, BMPR1A, BRCA1, BRCA2, BRIP1, CDH1, CDK4, CDKN2A (p14ARF), CDKN2A (p16INK4a), CHEK2, EPCAM, GREM1, MITF, MLH1, MSH2, MSH6, MUTYH, NBN, PALB2, PMS2, POLD1, POLE, PTEN, RAD51C, RAD51D, SMAD4, STK11, and TP53.     PLAN: After considering the risks, benefits, and limitations, Ms. Ranganathan  provided informed consent to pursue genetic testing and the blood sample was sent to [facility] for analysis of the 30-gene panel. Results should be available within approximately 3-4 weeks' time, at which point they will be disclosed by telephone to Ms. Kasel, as will any additional recommendations warranted by these results. Ms. Garcia will receive a summary of her genetic counseling visit and a copy of her results once available. This information will also be available in Epic. We encouraged Ms. Vanosdol to remain in contact with cancer genetics annually so that we can continuously update the family history and inform her of any changes in cancer genetics and testing that may be of benefit for her family. Ms. Cunliffe questions were answered to her satisfaction today. Our contact information was provided should additional questions or concerns arise.  Lastly, we encouraged Ms. Gloss to remain in  contact with cancer genetics annually so that we can continuously update the family history and inform her of any changes in cancer genetics and testing that may be of benefit for this family.   Ms.  Barnard questions were answered to her satisfaction today. Our contact information was provided should additional questions or concerns arise. Thank you for the referral and allowing [place] to share in the care of your patient.   Jerick Khachatryan P. Florene Glen, [place], [facility] Certified Genetic Counselor [email or web address] phone: [phone]  The patient was seen for a total of 45 minutes in face-to-face genetic counseling.  This patient was discussed with Drs. Magrinat, Lindi Adie and/or Burr Medico who agrees with the above.    _______________________________________________________________________ For Office Staff:  Number of people involved in session: 1 Was an Intern/ student involved with case: no

## 2016-02-23 ENCOUNTER — Encounter: Payer: Self-pay | Admitting: Genetic Counselor

## 2016-02-23 ENCOUNTER — Ambulatory Visit: Payer: Self-pay | Admitting: Genetic Counselor

## 2016-02-23 ENCOUNTER — Telehealth: Payer: Self-pay | Admitting: Genetic Counselor

## 2016-02-23 DIAGNOSIS — Z800 Family history of malignant neoplasm of digestive organs: Secondary | ICD-10-CM

## 2016-02-23 DIAGNOSIS — Z8041 Family history of malignant neoplasm of ovary: Secondary | ICD-10-CM

## 2016-02-23 DIAGNOSIS — Z1379 Encounter for other screening for genetic and chromosomal anomalies: Secondary | ICD-10-CM

## 2016-02-23 DIAGNOSIS — Z803 Family history of malignant neoplasm of breast: Secondary | ICD-10-CM

## 2016-02-23 DIAGNOSIS — Z8042 Family history of malignant neoplasm of prostate: Secondary | ICD-10-CM

## 2016-02-23 NOTE — Telephone Encounter (Signed)
Revealed that she did not have the BRCA mutation that was found in her maternal aunt. Discussed that a CHEK2 VUS was identified, but that these are not clinically relevant and therefore management would not be changed.  Most VUS are reclassified in the future and most are determined to be benign.  Patient voiced her understanding.

## 2016-02-23 NOTE — Progress Notes (Signed)
HPI: Ms. Lavin was previously seen in the [facility] due to a family history of cancer, a known family mutation (KFM) in [place], and concerns regarding a hereditary predisposition to cancer. Please refer to our prior cancer genetics clinic note for more information regarding Ms. Scroggin's medical, social and family histories, and our assessment and recommendations, at the time. Ms. Barba recent genetic test results were disclosed to her, as were recommendations warranted by these results. These results and recommendations are discussed in more detail below.  FAMILY HISTORY:  We obtained a detailed, 4-generation family history.  Significant diagnoses are listed below: Family History  Problem Relation Age of Onset  . CAD    . Cancer Mother 31    leiomyosarcoma  . Prostate cancer Father 61  . Congenital heart disease Maternal Aunt     died in surgery at 60  . Liver cancer Paternal Aunt     probably not primary cancer  . Stomach cancer Maternal Grandmother 87  . CAD Maternal Grandfather   . Breast cancer Paternal Grandmother     dx in her 16s, died at 61  . Ovarian cancer Maternal Aunt 60  . Cancer Paternal Aunt     NOS  . Diabetes Paternal Aunt   . Breast cancer Other 54    MGMs sister  . Colon cancer Other     MGMs brother  . Breast cancer Other     MGMs mother  . Breast cancer Other 76    MGMs maternal aunt  . Breast cancer Cousin     3 of patient's mother's first cousins    The patient has two daughters ages 33 and 16 who are cancer free.  She has two brothers who are also cancer free.  The patient's mother died of a leiomyosarcoma at age 50.  She had two sisters (twins) and a brother.  One sister died at 52 during heart surgery and the other died of ovarian cancer.  This sister tested positive for a BRCA2 mutation, as did her daughter.  The brother is cancer free.  The patient's maternal grandmother died of stomach cancer.  She had a brother and  sister.  The sister had breast cancer at 29 and had a daughter who had breast cancer at 22.  The brother had colon cancer and had three daughters, two who had breast cancer and the third with multiple myeloma.  The patient's maternal great-grandmother had breast cancer as did her sister who was diagnosed at 6 and had a daughter with colon cancer.    GENETIC TEST RESULTS: We recommended Ms. Lasecki pursue testing for the [facility] 30-gene panel that would test for not only the familial hereditary cancer gene mutation called BRCA2, c.1684C>T, but also additional genes. Ms. Peyser test was normal and did not reveal the familial mutation. We call this result a true negative result because the cancer-causing mutation was identified in Ms. Sharp's family, and she did not inherit it.  Given this negative result, Ms. Fischbach's chances of developing BRCA-related cancers are the same as they are in the general population.    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 At this time, it is unknown if this variant is associated with increased cancer risk or if this is a normal finding, but most variants such as this get reclassified to being inconsequential. It should not be used to make medical management decisions. With time, we suspect the  lab will determine the significance of this variant, if any. If we do learn more about it, we will try to contact Ms. Huffaker to discuss it further. However, it is important to stay in touch with [place] periodically and keep the address and phone number up to date.   CANCER SCREENING RECOMMENDATIONS: This normal result is reassuring and indicates that Ms. Ebarb does not likely have an increased risk of cancer due to a mutation in one of these genes.  We, therefore, recommended  Ms. Vanstone continue to follow the cancer screening guidelines provided by her primary healthcare providers.   RECOMMENDATIONS FOR  FAMILY MEMBERS: Women in this family might be at some increased risk of developing cancer, over the general population risk, simply due to the family history of cancer. We recommended women in this family have a yearly mammogram beginning at age 45, or 35 years younger than the earliest onset of cancer, an an annual clinical breast exam, and perform monthly breast self-exams. Women in this family should also have a gynecological exam as recommended by their primary provider. All family members should have a colonoscopy by age 63.  Based on Ms. Nelles's family history, we recommended her brothers, who have approximately a 25% chance of also having the BRCA2 KFM, have genetic counseling and testing. Ms. Folk will let us know if we can be of any assistance in coordinating genetic counseling and/or testing for this family member.   FOLLOW-UP: Lastly, we discussed with Ms. Balon that cancer genetics is a rapidly advancing field and it is possible that new genetic tests will be appropriate for her and/or her family members in the future. We encouraged her to remain in contact with cancer genetics on an annual basis so we can update her personal and family histories and let her know of advances in cancer genetics that may benefit this family.   Our contact number was provided. Ms. Downum questions were answered to her satisfaction, and she knows she is welcome to call us at anytime with additional questions or concerns.   Roma Kayser, MS, [facility] Certified Genetic Counselor [email or web address]

## 2016-02-29 DIAGNOSIS — E7800 Pure hypercholesterolemia, unspecified: Secondary | ICD-10-CM | POA: Diagnosis not present

## 2016-02-29 DIAGNOSIS — E1165 Type 2 diabetes mellitus with hyperglycemia: Secondary | ICD-10-CM | POA: Diagnosis not present

## 2016-02-29 DIAGNOSIS — E049 Nontoxic goiter, unspecified: Secondary | ICD-10-CM | POA: Diagnosis not present

## 2016-03-07 DIAGNOSIS — E049 Nontoxic goiter, unspecified: Secondary | ICD-10-CM | POA: Diagnosis not present

## 2016-03-07 DIAGNOSIS — E7800 Pure hypercholesterolemia, unspecified: Secondary | ICD-10-CM | POA: Diagnosis not present

## 2016-03-07 DIAGNOSIS — E1165 Type 2 diabetes mellitus with hyperglycemia: Secondary | ICD-10-CM | POA: Diagnosis not present

## 2016-03-31 ENCOUNTER — Other Ambulatory Visit: Payer: Self-pay | Admitting: Obstetrics and Gynecology

## 2016-03-31 DIAGNOSIS — Z1231 Encounter for screening mammogram for malignant neoplasm of breast: Secondary | ICD-10-CM

## 2016-04-19 ENCOUNTER — Ambulatory Visit
Admission: RE | Admit: 2016-04-19 | Discharge: 2016-04-19 | Disposition: A | Discharge status: Home / Self Care | Payer: BLUE CROSS/BLUE SHIELD | Source: Ambulatory Visit | Attending: Obstetrics and Gynecology | Admitting: Obstetrics and Gynecology

## 2016-04-19 DIAGNOSIS — Z1231 Encounter for screening mammogram for malignant neoplasm of breast: Secondary | ICD-10-CM | POA: Diagnosis not present

## 2016-04-25 DIAGNOSIS — N950 Postmenopausal bleeding: Secondary | ICD-10-CM | POA: Diagnosis not present

## 2016-05-04 DIAGNOSIS — N950 Postmenopausal bleeding: Secondary | ICD-10-CM | POA: Diagnosis not present

## 2016-05-20 DIAGNOSIS — N952 Postmenopausal atrophic vaginitis: Secondary | ICD-10-CM | POA: Diagnosis not present

## 2016-05-20 DIAGNOSIS — N760 Acute vaginitis: Secondary | ICD-10-CM | POA: Diagnosis not present

## 2016-07-11 DIAGNOSIS — R0789 Other chest pain: Secondary | ICD-10-CM | POA: Diagnosis not present

## 2016-07-11 DIAGNOSIS — Z20828 Contact with and (suspected) exposure to other viral communicable diseases: Secondary | ICD-10-CM | POA: Diagnosis not present

## 2016-07-11 DIAGNOSIS — Z23 Encounter for immunization: Secondary | ICD-10-CM | POA: Diagnosis not present

## 2016-07-11 DIAGNOSIS — E041 Nontoxic single thyroid nodule: Secondary | ICD-10-CM | POA: Diagnosis not present

## 2016-07-11 DIAGNOSIS — Z0001 Encounter for general adult medical examination with abnormal findings: Secondary | ICD-10-CM | POA: Diagnosis not present

## 2016-07-15 ENCOUNTER — Encounter: Payer: Self-pay | Admitting: *Deleted

## 2016-07-15 NOTE — Progress Notes (Signed)
Cardiology Office Note   Date:  07/18/2016   ID:  Laura Marshall, DOB [birth year], MRN [id number]  PCP:  Levon HedgerEBBIE SCHOENHOFF, MD    No chief complaint on file. chest discomfort   Wt Readings from Last 3 Encounters:  07/18/16 137 lb (62.1 kg)       History of Present Illness: Laura RangerSally B Marshall is a 51 y.o. female  Who has a feeling in her chest with exertion that is described a "gushing of blood."  It occurs after she finishes exercising and lasts for just a few minutes. It resolves with rest. It does not occur at any other time of the day.   She continues to exercise at a high level.  No lightheadedness.  No associated SHOB or arm pain.  No neck pain.  No trouble lying flat or having to sit up.  No change in endurance.  She can feel a flutter in her chest in the morning, that feels like a skipped beat.  Does not occur with exercise or at any other time of the day.  It is very short-lived and there are no associated symptoms of lightheadedness or syncope.  SHe has had a high CRP in the past.  Negative echo in 2011.  Her father has  had CHF, CAD.  MI at 3555.  He had CABG in his late 5160s.  He has an LVAD at age 52.  She runs three miles on the treadmill and does thes 3-4 x/week.    Maternal GF had MI at age 52.   No siblings with CAD.      Past Medical History:  Diagnosis Date  . Anxiety   . Depression   . Family history of breast cancer   . Family history of colon cancer   . Family history of ovarian cancer   . Family history of prostate cancer   . Family history of stomach cancer   . Heart murmur   . Hyperlipidemia   . Thyroid nodule   . Type 2 diabetes mellitus (HCC)     History reviewed. No pertinent surgical history.   Current Outpatient Prescriptions  Medication Sig Dispense Refill  . Docusate Sodium (COLACE PO) Take by mouth as needed.    . drospirenone-ethinyl estradiol (YAZ,GIANVI,LORYNA) 3-0.02 MG tablet Take 1 tablet by mouth daily.    Marland Kitchen.  levothyroxine (SYNTHROID, LEVOTHROID) 25 MCG tablet Take 25 mcg by mouth daily before breakfast.    . Prenatal Multivit-Min-Fe-FA (PRENATAL VITAMINS PO) Take 1 tablet by mouth daily.    . Pseudoephedrine HCl (SUDAFED PO) Take by mouth as needed.    . rosuvastatin (CRESTOR) 5 MG tablet Take 5 mg by mouth daily.     No current facility-administered medications for this visit.     Allergies:   Minocin [minocycline hcl]    Social History:  The patient  reports that she has never smoked. She has never used smokeless tobacco. She reports that she drinks about 1.2 - 1.8 oz of alcohol per week . She reports that she does not use drugs.   Family History:  The patient's family history includes Breast cancer in her cousin, other, and paternal grandmother; Breast cancer (age of onset: 9338) in her other; Breast cancer (age of onset: 4050) in her other; CAD in her father and maternal grandfather; Cancer in her paternal aunt; Cancer (age of onset: 7867) in her mother; Colon cancer in her cousin and other; Congenital heart disease in her maternal aunt; Congestive Heart  Failure in her father and maternal grandfather; Diabetes in her brother, daughter, father, and paternal aunt; Diabetes type I in her daughter; Kidney Stones in her brother; Liver cancer in her paternal aunt; Meniere's disease in her brother; Other in her daughter; Ovarian cancer in her maternal aunt; Ovarian cancer (age of onset: 102) in her maternal aunt; Prostate cancer (age of onset: 76) in her father; Stomach cancer (age of onset: 28) in her maternal grandmother; Valvular heart disease in her maternal grandfather.    ROS:  Please see the history of present illness.   Otherwise, review of systems are positive for "gushing feeling".   All other systems are reviewed and negative.    PHYSICAL EXAM: VS:  BP 136/90   Pulse 82   Ht 5\' 3"  (1.6 m)   Wt 137 lb (62.1 kg)   BMI 24.27 kg/m  , BMI Body mass index is 24.27 kg/m. GEN: Well nourished, well  developed, in no acute distress  HEENT: normal  Neck: no JVD, carotid bruits, or masses Cardiac: RRR; no murmurs, rubs, or gallops,no edema  Respiratory:  clear to auscultation bilaterally, normal work of breathing GI: soft, nontender, nondistended, + BS MS: no deformity or atrophy  Skin: warm and dry, no rash Neuro:  Strength and sensation are intact Psych: euthymic mood, full affect   EKG:   The ekg ordered at [facility] demonstrates normal ECG   Recent Labs: No results found for requested labs within last 8760 hours.   Lipid Panel No results found for: CHOL, TRIG, HDL, CHOLHDL, VLDL, LDLCALC, LDLDIRECT   Other studies Reviewed: Additional studies/ records that were reviewed today with results demonstrating: normal LVEF by echo in 2011; normal EKG.   ASSESSMENT AND PLAN:  1. Chest discomfort: Atypical. Not occurring when she is active. She completes a vigorous workout multiple times a week without any difficulty. No change in her endurance. No dyspnea on exertion or lightheadedness. I asked her to follow her blood pressure more closely in the event this "gushing" feeling is related to high blood pressure. If there is any change in her symptoms with either chest discomfort or decreased endurance, she will let us know.  We would plan for exercise treadmill test if her symptoms change. She is comfortable with this plan. 2. Family history of heart disease: She is managing her risk factors well. She exercises regularly. She has had borderline diabetes which is diet controlled. She eats a healthy diet. I encouraged her to continue with both of these habits. 3. Hyperlipidemia: Continue Crestor. 4. Palpitations: Rare.  They sound benign and are likely premature beats.   Current medicines are reviewed at length with the patient today.  The patient concerns regarding her medicines were addressed.  The following changes have been made:  No change  Labs/ tests ordered today include:  No  orders of the defined types were placed in this encounter.   Recommend 150 minutes/week of aerobic exercise Low fat, low carb, high fiber diet recommended  Disposition:   FU as needed, if sx change   Signed, Lance Muss, MD  07/18/2016 9:12 AM    [facility] [address] [place], [place], [place]  [address] Phone: [phone]; Fax: [phone]

## 2016-07-18 ENCOUNTER — Ambulatory Visit (INDEPENDENT_AMBULATORY_CARE_PROVIDER_SITE_OTHER): Payer: BLUE CROSS/BLUE SHIELD | Admitting: Interventional Cardiology

## 2016-07-18 ENCOUNTER — Encounter: Payer: Self-pay | Admitting: Interventional Cardiology

## 2016-07-18 VITALS — BP 136/90 | HR 82 | Ht 63.0 in | Wt 137.0 lb

## 2016-07-18 DIAGNOSIS — R002 Palpitations: Secondary | ICD-10-CM | POA: Diagnosis not present

## 2016-07-18 DIAGNOSIS — Z8249 Family history of ischemic heart disease and other diseases of the circulatory system: Secondary | ICD-10-CM | POA: Diagnosis not present

## 2016-07-18 DIAGNOSIS — E782 Mixed hyperlipidemia: Secondary | ICD-10-CM

## 2016-07-18 DIAGNOSIS — R0789 Other chest pain: Secondary | ICD-10-CM

## 2016-07-18 NOTE — Patient Instructions (Signed)
Medication Instructions:  Same-no changes  Labwork: None  Testing/Procedures: None  Follow-Up: As needed     If you need a refill on your cardiac medications before your next appointment, please call your pharmacy.   

## 2016-07-21 DIAGNOSIS — Z86018 Personal history of other benign neoplasm: Secondary | ICD-10-CM | POA: Diagnosis not present

## 2016-07-21 DIAGNOSIS — Z85828 Personal history of other malignant neoplasm of skin: Secondary | ICD-10-CM | POA: Diagnosis not present

## 2016-07-21 DIAGNOSIS — D1800 Hemangioma unspecified site: Secondary | ICD-10-CM | POA: Diagnosis not present

## 2016-07-21 DIAGNOSIS — L814 Other melanin hyperpigmentation: Secondary | ICD-10-CM | POA: Diagnosis not present

## 2016-09-10 ENCOUNTER — Encounter (HOSPITAL_COMMUNITY): Payer: Self-pay

## 2016-10-10 DIAGNOSIS — E7800 Pure hypercholesterolemia, unspecified: Secondary | ICD-10-CM | POA: Diagnosis not present

## 2016-10-10 DIAGNOSIS — I10 Essential (primary) hypertension: Secondary | ICD-10-CM | POA: Diagnosis not present

## 2016-10-10 DIAGNOSIS — R0789 Other chest pain: Secondary | ICD-10-CM | POA: Diagnosis not present

## 2016-10-10 DIAGNOSIS — Z803 Family history of malignant neoplasm of breast: Secondary | ICD-10-CM | POA: Diagnosis not present

## 2017-01-04 ENCOUNTER — Other Ambulatory Visit: Payer: Self-pay | Admitting: Endocrinology

## 2017-01-04 DIAGNOSIS — E049 Nontoxic goiter, unspecified: Secondary | ICD-10-CM

## 2017-01-11 ENCOUNTER — Other Ambulatory Visit: Payer: BLUE CROSS/BLUE SHIELD

## 2017-02-27 DIAGNOSIS — E049 Nontoxic goiter, unspecified: Secondary | ICD-10-CM | POA: Diagnosis not present

## 2017-02-27 DIAGNOSIS — E7800 Pure hypercholesterolemia, unspecified: Secondary | ICD-10-CM | POA: Diagnosis not present

## 2017-02-27 DIAGNOSIS — E1165 Type 2 diabetes mellitus with hyperglycemia: Secondary | ICD-10-CM | POA: Diagnosis not present

## 2017-03-01 ENCOUNTER — Ambulatory Visit
Admission: RE | Admit: 2017-03-01 | Discharge: 2017-03-01 | Disposition: A | Discharge status: Home / Self Care | Payer: BLUE CROSS/BLUE SHIELD | Source: Ambulatory Visit | Attending: Endocrinology | Admitting: Endocrinology

## 2017-03-01 DIAGNOSIS — E049 Nontoxic goiter, unspecified: Secondary | ICD-10-CM

## 2017-03-01 DIAGNOSIS — E041 Nontoxic single thyroid nodule: Secondary | ICD-10-CM | POA: Diagnosis not present

## 2017-03-02 DIAGNOSIS — R300 Dysuria: Secondary | ICD-10-CM | POA: Diagnosis not present

## 2017-03-02 DIAGNOSIS — N760 Acute vaginitis: Secondary | ICD-10-CM | POA: Diagnosis not present

## 2017-03-02 DIAGNOSIS — N941 Unspecified dyspareunia: Secondary | ICD-10-CM | POA: Diagnosis not present

## 2017-03-06 DIAGNOSIS — E7800 Pure hypercholesterolemia, unspecified: Secondary | ICD-10-CM | POA: Diagnosis not present

## 2017-03-06 DIAGNOSIS — E1165 Type 2 diabetes mellitus with hyperglycemia: Secondary | ICD-10-CM | POA: Diagnosis not present

## 2017-03-06 DIAGNOSIS — E049 Nontoxic goiter, unspecified: Secondary | ICD-10-CM | POA: Diagnosis not present

## 2017-03-08 DIAGNOSIS — N952 Postmenopausal atrophic vaginitis: Secondary | ICD-10-CM | POA: Diagnosis not present

## 2017-03-22 DIAGNOSIS — Z23 Encounter for immunization: Secondary | ICD-10-CM | POA: Diagnosis not present

## 2017-03-22 DIAGNOSIS — S80819A Abrasion, unspecified lower leg, initial encounter: Secondary | ICD-10-CM | POA: Diagnosis not present

## 2017-03-24 DIAGNOSIS — Z01419 Encounter for gynecological examination (general) (routine) without abnormal findings: Secondary | ICD-10-CM | POA: Diagnosis not present

## 2017-03-24 DIAGNOSIS — Z6823 Body mass index (BMI) 23.0-23.9, adult: Secondary | ICD-10-CM | POA: Diagnosis not present

## 2017-03-24 DIAGNOSIS — R87610 Atypical squamous cells of undetermined significance on cytologic smear of cervix (ASC-US): Secondary | ICD-10-CM | POA: Diagnosis not present

## 2017-04-05 DIAGNOSIS — J0120 Acute ethmoidal sinusitis, unspecified: Secondary | ICD-10-CM | POA: Diagnosis not present

## 2017-04-05 DIAGNOSIS — R509 Fever, unspecified: Secondary | ICD-10-CM | POA: Diagnosis not present

## 2017-04-11 ENCOUNTER — Other Ambulatory Visit: Payer: Self-pay | Admitting: Obstetrics and Gynecology

## 2017-04-11 DIAGNOSIS — Z1231 Encounter for screening mammogram for malignant neoplasm of breast: Secondary | ICD-10-CM

## 2017-04-28 ENCOUNTER — Ambulatory Visit: Payer: BLUE CROSS/BLUE SHIELD

## 2017-05-17 DIAGNOSIS — J028 Acute pharyngitis due to other specified organisms: Secondary | ICD-10-CM | POA: Diagnosis not present

## 2017-05-17 DIAGNOSIS — R509 Fever, unspecified: Secondary | ICD-10-CM | POA: Diagnosis not present

## 2017-05-19 DIAGNOSIS — J029 Acute pharyngitis, unspecified: Secondary | ICD-10-CM | POA: Diagnosis not present

## 2017-05-19 DIAGNOSIS — H6693 Otitis media, unspecified, bilateral: Secondary | ICD-10-CM | POA: Diagnosis not present

## 2017-07-13 ENCOUNTER — Ambulatory Visit
Admission: RE | Admit: 2017-07-13 | Discharge: 2017-07-13 | Disposition: A | Discharge status: Home / Self Care | Payer: BLUE CROSS/BLUE SHIELD | Source: Ambulatory Visit | Attending: Obstetrics and Gynecology | Admitting: Obstetrics and Gynecology

## 2017-07-13 DIAGNOSIS — Z1231 Encounter for screening mammogram for malignant neoplasm of breast: Secondary | ICD-10-CM | POA: Diagnosis not present

## 2017-07-17 DIAGNOSIS — Z0000 Encounter for general adult medical examination without abnormal findings: Secondary | ICD-10-CM | POA: Diagnosis not present

## 2017-07-17 DIAGNOSIS — I10 Essential (primary) hypertension: Secondary | ICD-10-CM | POA: Diagnosis not present

## 2017-07-17 DIAGNOSIS — Z23 Encounter for immunization: Secondary | ICD-10-CM | POA: Diagnosis not present

## 2017-07-17 DIAGNOSIS — E7800 Pure hypercholesterolemia, unspecified: Secondary | ICD-10-CM | POA: Diagnosis not present

## 2017-07-17 DIAGNOSIS — E039 Hypothyroidism, unspecified: Secondary | ICD-10-CM | POA: Diagnosis not present

## 2017-07-20 DIAGNOSIS — E119 Type 2 diabetes mellitus without complications: Secondary | ICD-10-CM | POA: Diagnosis not present

## 2017-08-08 DIAGNOSIS — N952 Postmenopausal atrophic vaginitis: Secondary | ICD-10-CM | POA: Diagnosis not present

## 2017-09-07 DIAGNOSIS — Z86018 Personal history of other benign neoplasm: Secondary | ICD-10-CM | POA: Diagnosis not present

## 2017-09-07 DIAGNOSIS — Z85828 Personal history of other malignant neoplasm of skin: Secondary | ICD-10-CM | POA: Diagnosis not present

## 2017-09-07 DIAGNOSIS — L814 Other melanin hyperpigmentation: Secondary | ICD-10-CM | POA: Diagnosis not present

## 2017-09-07 DIAGNOSIS — D1800 Hemangioma unspecified site: Secondary | ICD-10-CM | POA: Diagnosis not present

## 2018-02-28 DIAGNOSIS — E1165 Type 2 diabetes mellitus with hyperglycemia: Secondary | ICD-10-CM | POA: Diagnosis not present

## 2018-02-28 DIAGNOSIS — E039 Hypothyroidism, unspecified: Secondary | ICD-10-CM | POA: Diagnosis not present

## 2018-02-28 DIAGNOSIS — E7800 Pure hypercholesterolemia, unspecified: Secondary | ICD-10-CM | POA: Diagnosis not present

## 2018-03-07 DIAGNOSIS — E039 Hypothyroidism, unspecified: Secondary | ICD-10-CM | POA: Diagnosis not present

## 2018-03-07 DIAGNOSIS — E7800 Pure hypercholesterolemia, unspecified: Secondary | ICD-10-CM | POA: Diagnosis not present

## 2018-03-07 DIAGNOSIS — E1165 Type 2 diabetes mellitus with hyperglycemia: Secondary | ICD-10-CM | POA: Diagnosis not present

## 2018-03-07 DIAGNOSIS — E049 Nontoxic goiter, unspecified: Secondary | ICD-10-CM | POA: Diagnosis not present

## 2018-07-05 ENCOUNTER — Other Ambulatory Visit: Payer: Self-pay | Admitting: Obstetrics and Gynecology

## 2018-07-05 DIAGNOSIS — Z1231 Encounter for screening mammogram for malignant neoplasm of breast: Secondary | ICD-10-CM

## 2018-07-18 DIAGNOSIS — Z0000 Encounter for general adult medical examination without abnormal findings: Secondary | ICD-10-CM | POA: Diagnosis not present

## 2018-07-18 DIAGNOSIS — Z23 Encounter for immunization: Secondary | ICD-10-CM | POA: Diagnosis not present

## 2018-07-18 DIAGNOSIS — E039 Hypothyroidism, unspecified: Secondary | ICD-10-CM | POA: Diagnosis not present

## 2018-07-18 DIAGNOSIS — I10 Essential (primary) hypertension: Secondary | ICD-10-CM | POA: Diagnosis not present

## 2018-08-07 ENCOUNTER — Ambulatory Visit
Admission: RE | Admit: 2018-08-07 | Discharge: 2018-08-07 | Disposition: A | Discharge status: Home / Self Care | Payer: BLUE CROSS/BLUE SHIELD | Source: Ambulatory Visit | Attending: Obstetrics and Gynecology | Admitting: Obstetrics and Gynecology

## 2018-08-07 DIAGNOSIS — Z1231 Encounter for screening mammogram for malignant neoplasm of breast: Secondary | ICD-10-CM | POA: Diagnosis not present

## 2018-12-25 DIAGNOSIS — Z6825 Body mass index (BMI) 25.0-25.9, adult: Secondary | ICD-10-CM | POA: Diagnosis not present

## 2018-12-25 DIAGNOSIS — Z124 Encounter for screening for malignant neoplasm of cervix: Secondary | ICD-10-CM | POA: Diagnosis not present

## 2018-12-25 DIAGNOSIS — Z1151 Encounter for screening for human papillomavirus (HPV): Secondary | ICD-10-CM | POA: Diagnosis not present

## 2018-12-25 DIAGNOSIS — Z01419 Encounter for gynecological examination (general) (routine) without abnormal findings: Secondary | ICD-10-CM | POA: Diagnosis not present

## 2019-03-04 DIAGNOSIS — E7800 Pure hypercholesterolemia, unspecified: Secondary | ICD-10-CM | POA: Diagnosis not present

## 2019-03-04 DIAGNOSIS — E1165 Type 2 diabetes mellitus with hyperglycemia: Secondary | ICD-10-CM | POA: Diagnosis not present

## 2019-03-04 DIAGNOSIS — E039 Hypothyroidism, unspecified: Secondary | ICD-10-CM | POA: Diagnosis not present

## 2019-03-11 DIAGNOSIS — E039 Hypothyroidism, unspecified: Secondary | ICD-10-CM | POA: Diagnosis not present

## 2019-03-11 DIAGNOSIS — E1165 Type 2 diabetes mellitus with hyperglycemia: Secondary | ICD-10-CM | POA: Diagnosis not present

## 2019-03-11 DIAGNOSIS — E7800 Pure hypercholesterolemia, unspecified: Secondary | ICD-10-CM | POA: Diagnosis not present

## 2019-03-11 DIAGNOSIS — E049 Nontoxic goiter, unspecified: Secondary | ICD-10-CM | POA: Diagnosis not present

## 2019-04-03 DIAGNOSIS — E119 Type 2 diabetes mellitus without complications: Secondary | ICD-10-CM | POA: Diagnosis not present

## 2019-05-21 DIAGNOSIS — Z23 Encounter for immunization: Secondary | ICD-10-CM | POA: Diagnosis not present

## 2019-05-21 DIAGNOSIS — W57XXXA Bitten or stung by nonvenomous insect and other nonvenomous arthropods, initial encounter: Secondary | ICD-10-CM | POA: Diagnosis not present

## 2019-05-21 DIAGNOSIS — S30860A Insect bite (nonvenomous) of lower back and pelvis, initial encounter: Secondary | ICD-10-CM | POA: Diagnosis not present

## 2019-06-05 DIAGNOSIS — E7800 Pure hypercholesterolemia, unspecified: Secondary | ICD-10-CM | POA: Diagnosis not present

## 2019-07-19 ENCOUNTER — Other Ambulatory Visit: Payer: Self-pay | Admitting: Obstetrics and Gynecology

## 2019-07-19 DIAGNOSIS — Z1231 Encounter for screening mammogram for malignant neoplasm of breast: Secondary | ICD-10-CM

## 2019-08-17 DIAGNOSIS — H6980 Other specified disorders of Eustachian tube, unspecified ear: Secondary | ICD-10-CM | POA: Diagnosis not present

## 2019-08-17 DIAGNOSIS — H6121 Impacted cerumen, right ear: Secondary | ICD-10-CM | POA: Diagnosis not present

## 2019-08-26 ENCOUNTER — Other Ambulatory Visit: Payer: Self-pay

## 2019-08-26 ENCOUNTER — Ambulatory Visit
Admission: RE | Admit: 2019-08-26 | Discharge: 2019-08-26 | Disposition: A | Discharge status: Home / Self Care | Payer: BLUE CROSS/BLUE SHIELD | Source: Ambulatory Visit | Attending: Obstetrics and Gynecology | Admitting: Obstetrics and Gynecology

## 2019-08-26 DIAGNOSIS — Z1231 Encounter for screening mammogram for malignant neoplasm of breast: Secondary | ICD-10-CM | POA: Diagnosis not present

## 2019-10-23 DIAGNOSIS — Z0000 Encounter for general adult medical examination without abnormal findings: Secondary | ICD-10-CM | POA: Diagnosis not present

## 2019-10-23 DIAGNOSIS — I10 Essential (primary) hypertension: Secondary | ICD-10-CM | POA: Diagnosis not present

## 2019-10-23 DIAGNOSIS — E7800 Pure hypercholesterolemia, unspecified: Secondary | ICD-10-CM | POA: Diagnosis not present

## 2019-11-06 ENCOUNTER — Ambulatory Visit: Payer: PRIVATE HEALTH INSURANCE | Attending: Internal Medicine

## 2019-11-11 ENCOUNTER — Ambulatory Visit: Payer: PRIVATE HEALTH INSURANCE | Attending: Internal Medicine

## 2019-11-11 DIAGNOSIS — Z20822 Contact with and (suspected) exposure to covid-19: Secondary | ICD-10-CM | POA: Diagnosis not present

## 2019-11-11 DIAGNOSIS — L821 Other seborrheic keratosis: Secondary | ICD-10-CM | POA: Diagnosis not present

## 2019-11-11 DIAGNOSIS — L814 Other melanin hyperpigmentation: Secondary | ICD-10-CM | POA: Diagnosis not present

## 2019-11-11 DIAGNOSIS — Z86018 Personal history of other benign neoplasm: Secondary | ICD-10-CM | POA: Diagnosis not present

## 2019-11-11 DIAGNOSIS — Z85828 Personal history of other malignant neoplasm of skin: Secondary | ICD-10-CM | POA: Diagnosis not present

## 2019-11-12 LAB — SARS-COV-2, NAA 2 DAY TAT

## 2019-11-12 LAB — NOVEL CORONAVIRUS, NAA: SARS-CoV-2, NAA: NOT DETECTED

## 2020-03-04 DIAGNOSIS — E1165 Type 2 diabetes mellitus with hyperglycemia: Secondary | ICD-10-CM | POA: Diagnosis not present

## 2020-03-04 DIAGNOSIS — E039 Hypothyroidism, unspecified: Secondary | ICD-10-CM | POA: Diagnosis not present

## 2020-03-04 DIAGNOSIS — E7800 Pure hypercholesterolemia, unspecified: Secondary | ICD-10-CM | POA: Diagnosis not present

## 2020-03-11 DIAGNOSIS — E1165 Type 2 diabetes mellitus with hyperglycemia: Secondary | ICD-10-CM | POA: Diagnosis not present

## 2020-03-11 DIAGNOSIS — E039 Hypothyroidism, unspecified: Secondary | ICD-10-CM | POA: Diagnosis not present

## 2020-03-11 DIAGNOSIS — E7800 Pure hypercholesterolemia, unspecified: Secondary | ICD-10-CM | POA: Diagnosis not present

## 2020-03-11 DIAGNOSIS — E049 Nontoxic goiter, unspecified: Secondary | ICD-10-CM | POA: Diagnosis not present

## 2020-03-21 DIAGNOSIS — Z20822 Contact with and (suspected) exposure to covid-19: Secondary | ICD-10-CM | POA: Diagnosis not present

## 2020-05-18 DIAGNOSIS — E7800 Pure hypercholesterolemia, unspecified: Secondary | ICD-10-CM | POA: Diagnosis not present

## 2020-07-10 DIAGNOSIS — E119 Type 2 diabetes mellitus without complications: Secondary | ICD-10-CM | POA: Diagnosis not present

## 2020-08-25 ENCOUNTER — Other Ambulatory Visit: Payer: Self-pay | Admitting: Obstetrics and Gynecology

## 2020-08-25 DIAGNOSIS — Z1231 Encounter for screening mammogram for malignant neoplasm of breast: Secondary | ICD-10-CM

## 2020-10-07 ENCOUNTER — Ambulatory Visit
Admission: RE | Admit: 2020-10-07 | Discharge: 2020-10-07 | Disposition: A | Discharge status: Home / Self Care | Payer: BC Managed Care – PPO | Source: Ambulatory Visit

## 2020-10-07 ENCOUNTER — Other Ambulatory Visit: Payer: Self-pay

## 2020-10-07 DIAGNOSIS — Z1231 Encounter for screening mammogram for malignant neoplasm of breast: Secondary | ICD-10-CM

## 2020-10-29 DIAGNOSIS — U071 COVID-19: Secondary | ICD-10-CM | POA: Diagnosis not present

## 2020-10-29 DIAGNOSIS — R300 Dysuria: Secondary | ICD-10-CM | POA: Diagnosis not present

## 2020-10-29 DIAGNOSIS — R509 Fever, unspecified: Secondary | ICD-10-CM | POA: Diagnosis not present

## 2020-10-29 DIAGNOSIS — J069 Acute upper respiratory infection, unspecified: Secondary | ICD-10-CM | POA: Diagnosis not present

## 2020-11-19 DIAGNOSIS — Z01411 Encounter for gynecological examination (general) (routine) with abnormal findings: Secondary | ICD-10-CM | POA: Diagnosis not present

## 2020-11-19 DIAGNOSIS — Z01419 Encounter for gynecological examination (general) (routine) without abnormal findings: Secondary | ICD-10-CM | POA: Diagnosis not present

## 2020-11-19 DIAGNOSIS — Z6825 Body mass index (BMI) 25.0-25.9, adult: Secondary | ICD-10-CM | POA: Diagnosis not present

## 2020-11-19 DIAGNOSIS — Z124 Encounter for screening for malignant neoplasm of cervix: Secondary | ICD-10-CM | POA: Diagnosis not present

## 2020-12-02 DIAGNOSIS — U071 COVID-19: Secondary | ICD-10-CM | POA: Diagnosis not present

## 2020-12-02 DIAGNOSIS — J040 Acute laryngitis: Secondary | ICD-10-CM | POA: Diagnosis not present

## 2020-12-02 DIAGNOSIS — R0981 Nasal congestion: Secondary | ICD-10-CM | POA: Diagnosis not present

## 2020-12-02 DIAGNOSIS — R059 Cough, unspecified: Secondary | ICD-10-CM | POA: Diagnosis not present

## 2021-03-04 DIAGNOSIS — E7801 Familial hypercholesterolemia: Secondary | ICD-10-CM | POA: Diagnosis not present

## 2021-03-04 DIAGNOSIS — E1165 Type 2 diabetes mellitus with hyperglycemia: Secondary | ICD-10-CM | POA: Diagnosis not present

## 2021-03-04 DIAGNOSIS — E039 Hypothyroidism, unspecified: Secondary | ICD-10-CM | POA: Diagnosis not present

## 2021-03-15 DIAGNOSIS — E1165 Type 2 diabetes mellitus with hyperglycemia: Secondary | ICD-10-CM | POA: Diagnosis not present

## 2021-03-15 DIAGNOSIS — E049 Nontoxic goiter, unspecified: Secondary | ICD-10-CM | POA: Diagnosis not present

## 2021-03-15 DIAGNOSIS — E039 Hypothyroidism, unspecified: Secondary | ICD-10-CM | POA: Diagnosis not present

## 2021-03-15 DIAGNOSIS — E7800 Pure hypercholesterolemia, unspecified: Secondary | ICD-10-CM | POA: Diagnosis not present

## 2021-04-13 DIAGNOSIS — N760 Acute vaginitis: Secondary | ICD-10-CM | POA: Diagnosis not present

## 2021-04-13 DIAGNOSIS — N898 Other specified noninflammatory disorders of vagina: Secondary | ICD-10-CM | POA: Diagnosis not present

## 2021-04-21 DIAGNOSIS — R102 Pelvic and perineal pain: Secondary | ICD-10-CM | POA: Diagnosis not present

## 2021-04-21 DIAGNOSIS — N9489 Other specified conditions associated with female genital organs and menstrual cycle: Secondary | ICD-10-CM | POA: Diagnosis not present

## 2021-05-25 IMAGING — MG DIGITAL SCREENING BREAST BILAT IMPLANT W/ TOMO W/ CAD
8 of 12 series · 8 of 28 positions shown · non-contrast
Comparison: Prior films

CLINICAL DATA: Screening.

EXAM:
DIGITAL SCREENING BILATERAL MAMMOGRAM WITH IMPLANTS, CAD AND
TOMOSYNTHESIS
TECHNIQUE: Bilateral screening digital craniocaudal and mediolateral oblique
mammograms were obtained. Bilateral screening digital breast
tomosynthesis was performed. The images were evaluated with
computer-aided detection. Standard and/or implant displaced views
were performed.

[L MLO]
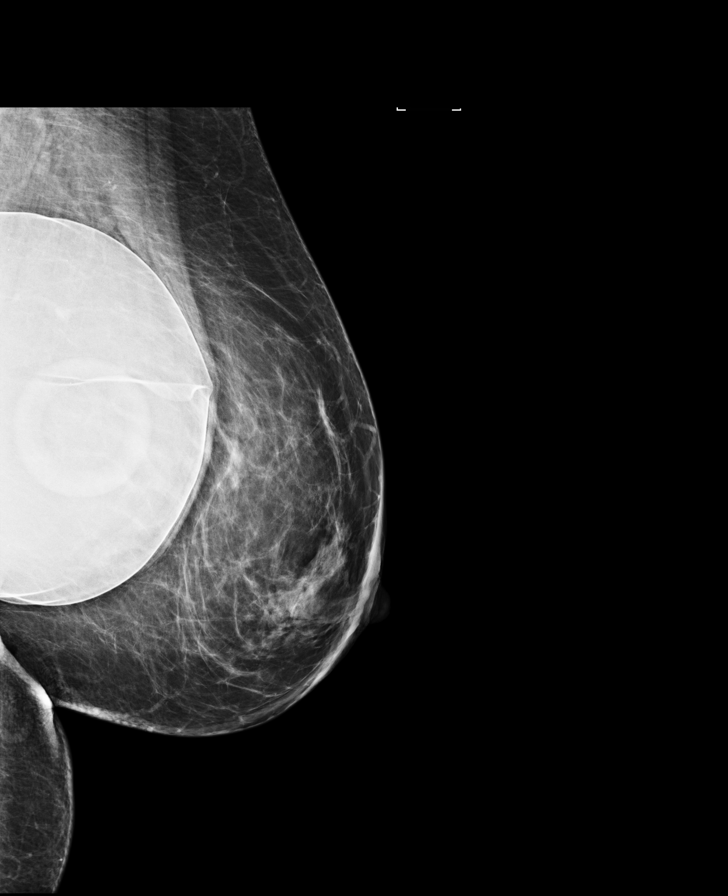

[L CC]
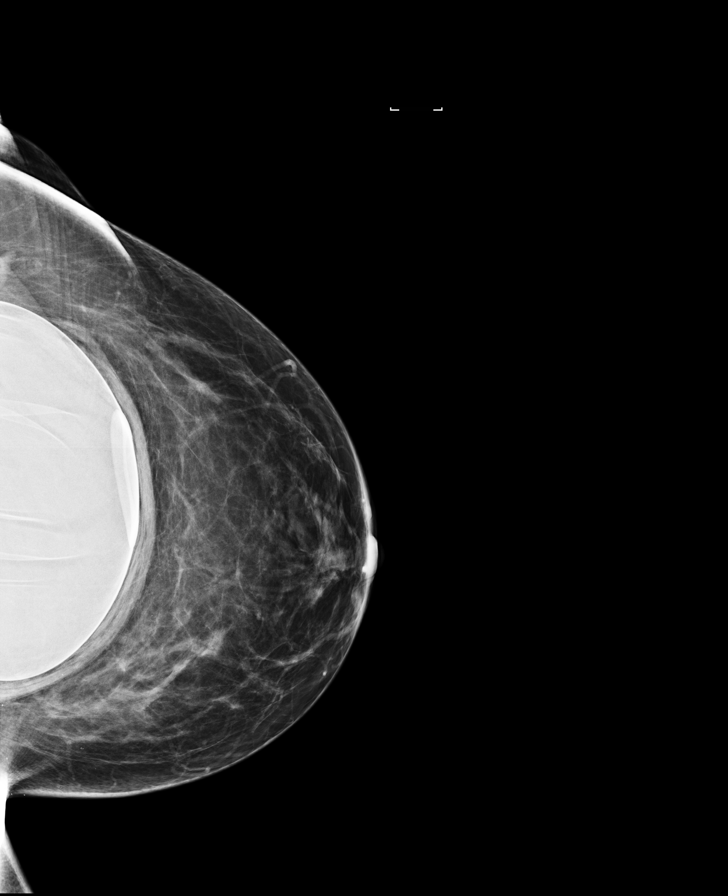

[R MLO]
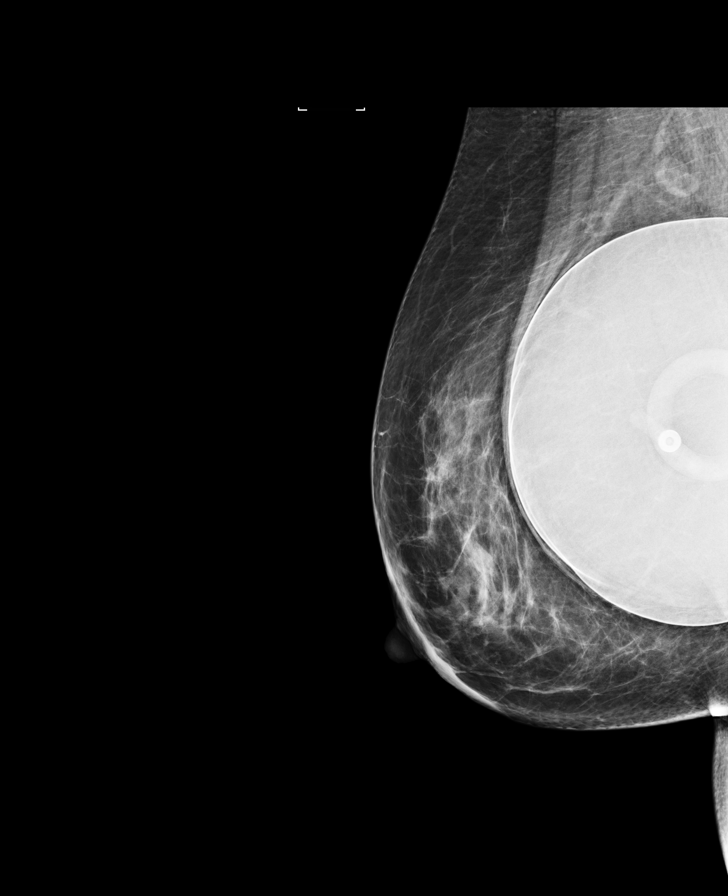

[R CC]
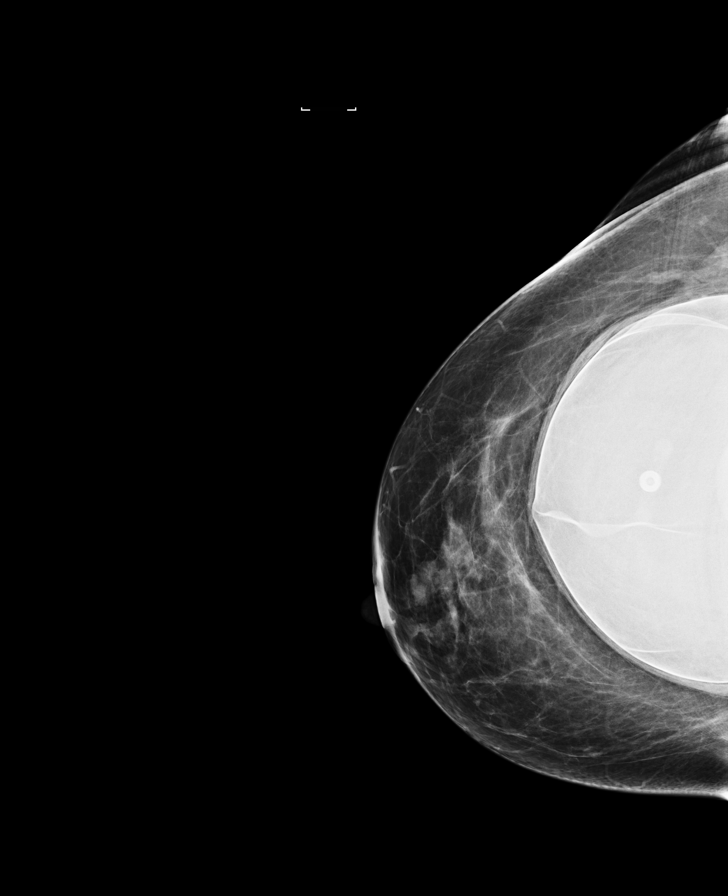

[R MLO synth-2D]
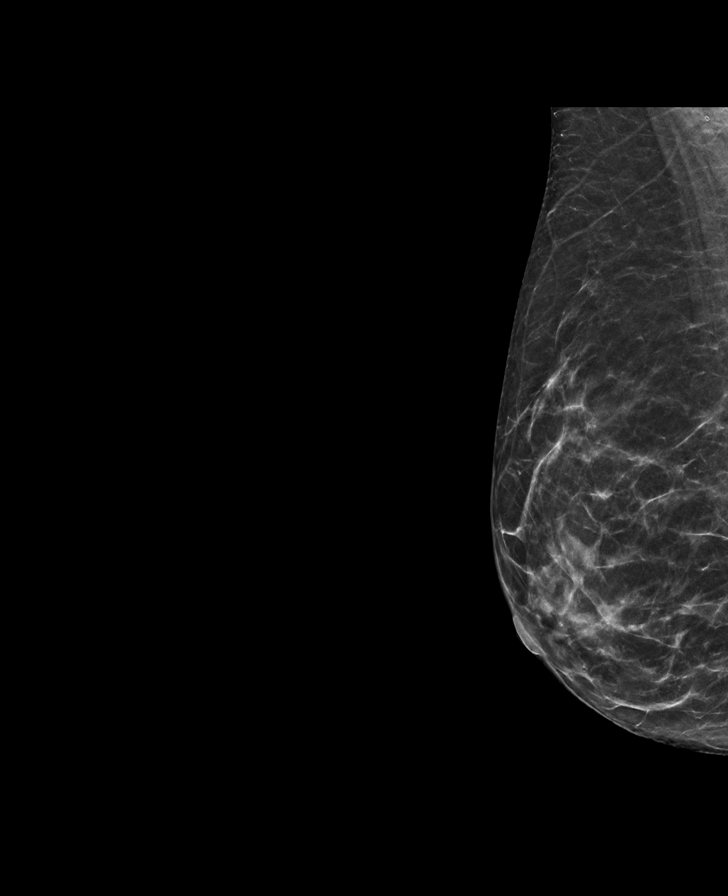

[L CC synth-2D]
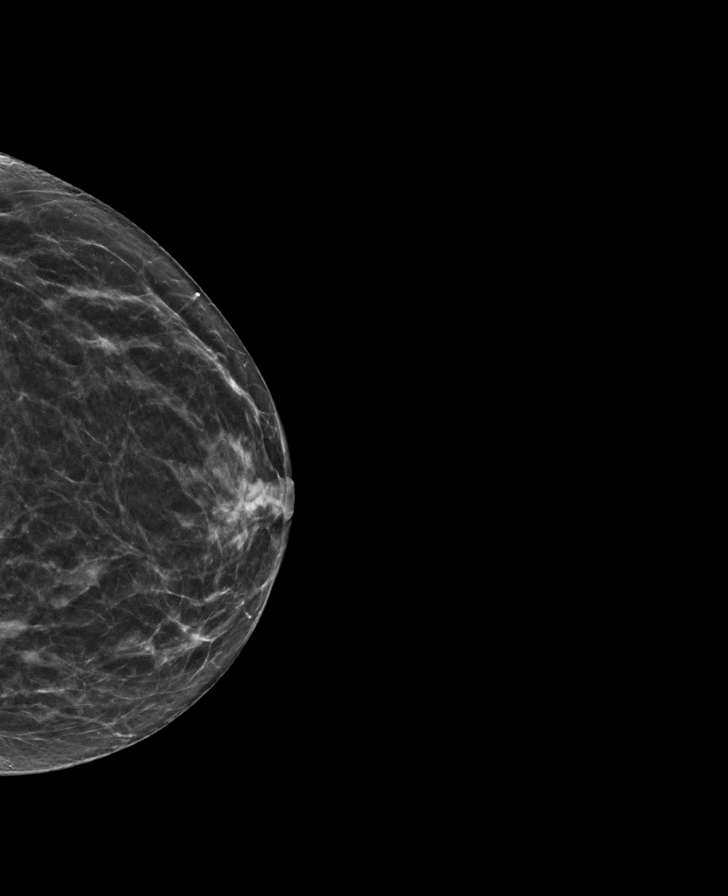

[R CC synth-2D]
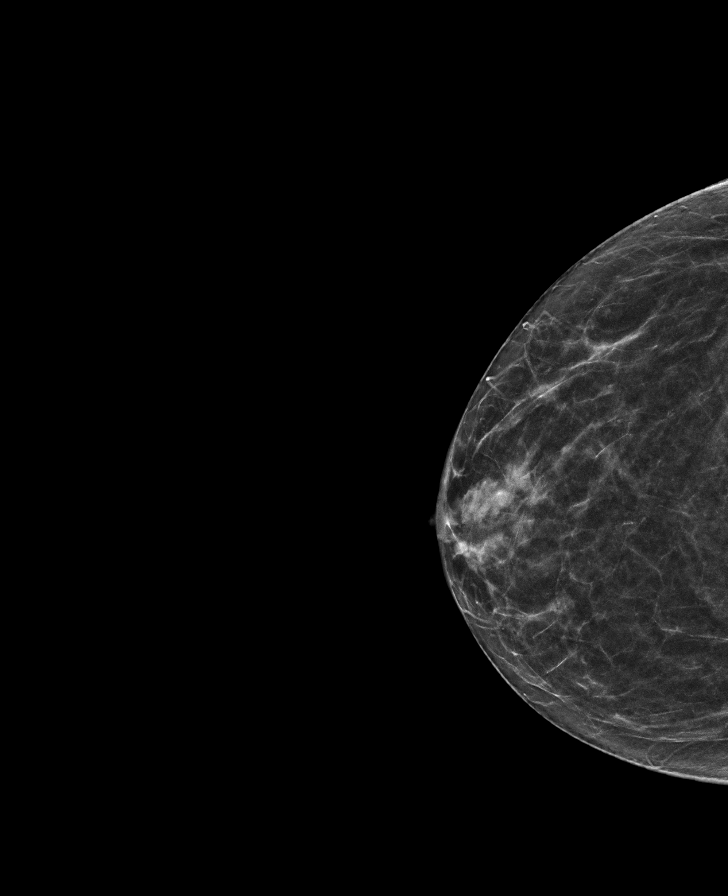

[L MLO synth-2D]
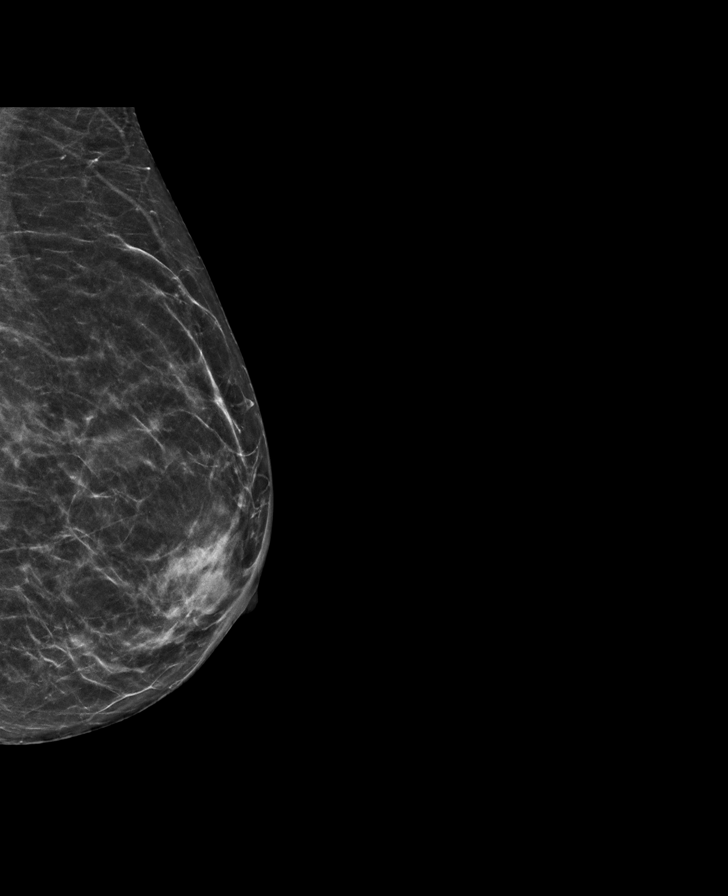

[8 of 28 positions shown; findings below may reference images not displayed]

ACR Breast Density Category b: There are scattered areas of
fibroglandular density.
FINDINGS: The patient has implants. There are no findings suspicious for
malignancy.
IMPRESSION: No mammographic evidence of malignancy. A result letter of this
screening mammogram will be mailed directly to the patient.

RECOMMENDATION:
Screening mammogram in one year. (Code:[id number])

BI-RADS CATEGORY  1:  Negative.

## 2021-08-30 DIAGNOSIS — L814 Other melanin hyperpigmentation: Secondary | ICD-10-CM | POA: Diagnosis not present

## 2021-08-30 DIAGNOSIS — L821 Other seborrheic keratosis: Secondary | ICD-10-CM | POA: Diagnosis not present

## 2021-08-30 DIAGNOSIS — D225 Melanocytic nevi of trunk: Secondary | ICD-10-CM | POA: Diagnosis not present

## 2021-08-30 DIAGNOSIS — L578 Other skin changes due to chronic exposure to nonionizing radiation: Secondary | ICD-10-CM | POA: Diagnosis not present

## 2021-09-06 ENCOUNTER — Other Ambulatory Visit: Payer: Self-pay | Admitting: Obstetrics and Gynecology

## 2021-09-16 DIAGNOSIS — B009 Herpesviral infection, unspecified: Secondary | ICD-10-CM | POA: Diagnosis not present

## 2021-11-19 DIAGNOSIS — Z6824 Body mass index (BMI) 24.0-24.9, adult: Secondary | ICD-10-CM | POA: Diagnosis not present

## 2021-11-19 DIAGNOSIS — Z01411 Encounter for gynecological examination (general) (routine) with abnormal findings: Secondary | ICD-10-CM | POA: Diagnosis not present

## 2021-11-19 DIAGNOSIS — Z124 Encounter for screening for malignant neoplasm of cervix: Secondary | ICD-10-CM | POA: Diagnosis not present

## 2021-11-19 DIAGNOSIS — Z0142 Encounter for cervical smear to confirm findings of recent normal smear following initial abnormal smear: Secondary | ICD-10-CM | POA: Diagnosis not present

## 2021-11-19 DIAGNOSIS — Z1231 Encounter for screening mammogram for malignant neoplasm of breast: Secondary | ICD-10-CM | POA: Diagnosis not present

## 2021-11-19 DIAGNOSIS — Z01419 Encounter for gynecological examination (general) (routine) without abnormal findings: Secondary | ICD-10-CM | POA: Diagnosis not present

## 2021-12-16 DIAGNOSIS — N72 Inflammatory disease of cervix uteri: Secondary | ICD-10-CM | POA: Diagnosis not present

## 2021-12-16 DIAGNOSIS — R87611 Atypical squamous cells cannot exclude high grade squamous intraepithelial lesion on cytologic smear of cervix (ASC-H): Secondary | ICD-10-CM | POA: Diagnosis not present

## 2022-02-18 DIAGNOSIS — E119 Type 2 diabetes mellitus without complications: Secondary | ICD-10-CM | POA: Diagnosis not present

## 2022-03-29 DIAGNOSIS — E039 Hypothyroidism, unspecified: Secondary | ICD-10-CM | POA: Diagnosis not present

## 2022-03-29 DIAGNOSIS — E7800 Pure hypercholesterolemia, unspecified: Secondary | ICD-10-CM | POA: Diagnosis not present

## 2022-03-29 DIAGNOSIS — E1165 Type 2 diabetes mellitus with hyperglycemia: Secondary | ICD-10-CM | POA: Diagnosis not present

## 2022-04-05 DIAGNOSIS — E049 Nontoxic goiter, unspecified: Secondary | ICD-10-CM | POA: Diagnosis not present

## 2022-04-05 DIAGNOSIS — E039 Hypothyroidism, unspecified: Secondary | ICD-10-CM | POA: Diagnosis not present

## 2022-04-05 DIAGNOSIS — E1165 Type 2 diabetes mellitus with hyperglycemia: Secondary | ICD-10-CM | POA: Diagnosis not present

## 2022-04-05 DIAGNOSIS — E7800 Pure hypercholesterolemia, unspecified: Secondary | ICD-10-CM | POA: Diagnosis not present

## 2022-04-18 DIAGNOSIS — L292 Pruritus vulvae: Secondary | ICD-10-CM | POA: Diagnosis not present

## 2022-04-18 DIAGNOSIS — N898 Other specified noninflammatory disorders of vagina: Secondary | ICD-10-CM | POA: Diagnosis not present

## 2022-06-21 DIAGNOSIS — R87611 Atypical squamous cells cannot exclude high grade squamous intraepithelial lesion on cytologic smear of cervix (ASC-H): Secondary | ICD-10-CM | POA: Diagnosis not present

## 2022-09-15 DIAGNOSIS — H6121 Impacted cerumen, right ear: Secondary | ICD-10-CM | POA: Diagnosis not present

## 2022-09-27 ENCOUNTER — Other Ambulatory Visit (HOSPITAL_COMMUNITY): Payer: Self-pay | Admitting: Family Medicine

## 2022-09-27 DIAGNOSIS — R011 Cardiac murmur, unspecified: Secondary | ICD-10-CM

## 2022-10-05 DIAGNOSIS — E7800 Pure hypercholesterolemia, unspecified: Secondary | ICD-10-CM | POA: Diagnosis not present

## 2022-10-21 ENCOUNTER — Ambulatory Visit (HOSPITAL_COMMUNITY): Payer: BC Managed Care – PPO | Attending: Family Medicine

## 2022-10-21 DIAGNOSIS — R011 Cardiac murmur, unspecified: Secondary | ICD-10-CM | POA: Diagnosis not present

## 2022-10-21 LAB — ECHOCARDIOGRAM COMPLETE
AV Mean grad: 10 mmHg
AV Peak grad: 17.9 mmHg
Ao pk vel: 2.11 m/s
Area-P 1/2: 2.87 cm2
MV M vel: 6.44 m/s
MV Peak grad: 165.9 mmHg
S' Lateral: 2.2 cm

## 2022-11-02 DIAGNOSIS — Z1231 Encounter for screening mammogram for malignant neoplasm of breast: Secondary | ICD-10-CM | POA: Diagnosis not present

## 2022-11-15 ENCOUNTER — Ambulatory Visit: Payer: BC Managed Care – PPO | Admitting: Genetic Counselor

## 2022-11-15 ENCOUNTER — Ambulatory Visit: Payer: BC Managed Care – PPO | Attending: Internal Medicine | Admitting: Internal Medicine

## 2022-11-15 ENCOUNTER — Ambulatory Visit (INDEPENDENT_AMBULATORY_CARE_PROVIDER_SITE_OTHER): Payer: BC Managed Care – PPO

## 2022-11-15 ENCOUNTER — Encounter: Payer: Self-pay | Admitting: Internal Medicine

## 2022-11-15 VITALS — BP 116/60 | HR 57 | Ht 63.0 in | Wt 146.0 lb

## 2022-11-15 DIAGNOSIS — I422 Other hypertrophic cardiomyopathy: Secondary | ICD-10-CM | POA: Diagnosis not present

## 2022-11-15 DIAGNOSIS — R001 Bradycardia, unspecified: Secondary | ICD-10-CM

## 2022-11-15 DIAGNOSIS — I700 Atherosclerosis of aorta: Secondary | ICD-10-CM | POA: Diagnosis not present

## 2022-11-15 DIAGNOSIS — R002 Palpitations: Secondary | ICD-10-CM | POA: Insufficient documentation

## 2022-11-15 DIAGNOSIS — E782 Mixed hyperlipidemia: Secondary | ICD-10-CM

## 2022-11-15 NOTE — Patient Instructions (Addendum)
Medication Instructions:  Your physician recommends that you continue on your current medications as directed. Please refer to the Current Medication list given to you today.  *If you need a refill on your cardiac medications before your next appointment, please call your pharmacy*   Lab Work: TODAY: CBC  If you have labs (blood work) drawn today and your tests are completely normal, you will receive your results only by: MyChart Message (if you have MyChart) OR A paper copy in the mail If you have any lab test that is abnormal or we need to change your treatment, we will call you to review the results.   Testing/Procedures: Your physician has requested that you have a cardiac MRI. Cardiac MRI uses a computer to create images of your heart as its beating, producing both still and moving pictures of your heart and major blood vessels. For further information please visit [email or web address]. Please follow the instruction sheet given to you today for more information.   Your physician has requested that you have an exercise tolerance test. For further information please visit [email or web address] Please also follow instruction sheet, as given.    Your physician has requested that you wear a heart monitor.   Follow-Up: At [facility], you and your health needs are our priority.  As part of our continuing mission to provide you with exceptional heart care, we have created designated Provider Care Teams.  These Care Teams include your primary Cardiologist (physician) and Advanced Practice Providers (APPs -  Physician Assistants and Nurse Practitioners) who all work together to provide you with the care you need, when you need it.   Your next appointment:   5-6 month(s)  Provider:   Riley Lam, MD  Other Instructions Christena Deem- Long Term Monitor Instructions  Your physician has requested you wear a ZIO patch monitor for 7 days.  This is a single patch monitor. [employer]  supplies one patch monitor per enrollment. Additional stickers are not available. Please do not apply patch if you will be having a Nuclear Stress Test,  Echocardiogram, Cardiac CT, MRI, or Chest Xray during the period you would be wearing the  monitor. The patch cannot be worn during these tests. You cannot remove and re-apply the  ZIO XT patch monitor.  Your ZIO patch monitor will be mailed 3 day USPS to your address on file. It may take 3-5 days  to receive your monitor after you have been enrolled.  Once you have received your monitor, please review the enclosed instructions. Your monitor  has already been registered assigning a specific monitor serial # to you.  Billing and Patient Assistance Program Information  We have supplied [employer] with any of your insurance information on file for billing purposes. [employer] offers a sliding scale Patient Assistance Program for patients that do not have  insurance, or whose insurance does not completely cover the cost of the ZIO monitor.  You must apply for the Patient Assistance Program to qualify for this discounted rate.  To apply, please call [employer] at [phone], select option 4, select option 2, ask to apply for  Patient Assistance Program. Meredeth Ide will ask your household income, and how many people  are in your household. They will quote your out-of-pocket cost based on that information.  [employer] will also be able to set up a 68-month, interest-free payment plan if needed.  Applying the monitor   Shave hair from upper left chest.  Hold abrader disc by orange tab. Rub abrader in 40  strokes over the upper left chest as  indicated in your monitor instructions.  Clean area with 4 enclosed alcohol pads. Let dry.  Apply patch as indicated in monitor instructions. Patch will be placed under collarbone on left  side of chest with arrow pointing upward.  Rub patch adhesive wings for 2 minutes. Remove white label marked "1". Remove the white   label marked "2". Rub patch adhesive wings for 2 additional minutes.  While looking in a mirror, press and release button in center of patch. A small green light will  flash 3-4 times. This will be your only indicator that the monitor has been turned on.  Do not shower for the first 24 hours. You may shower after the first 24 hours.  Press the button if you feel a symptom. You will hear a small click. Record Date, Time and  Symptom in the Patient Logbook.  When you are ready to remove the patch, follow instructions on the last 2 pages of Patient  Logbook. Stick patch monitor onto the last page of Patient Logbook.  Place Patient Logbook in the blue and white box. Use locking tab on box and tape box closed  securely. The blue and white box has prepaid postage on it. Please place it in the mailbox as  soon as possible. Your physician should have your test results approximately 7 days after the  monitor has been mailed back to [facility].  Call [facility] Customer Care at [phone] if you have questions regarding  your ZIO XT patch monitor. Call them immediately if you see an orange light blinking on your  monitor.  If your monitor falls off in less than 4 days, contact our Monitor department at [phone].  If your monitor becomes loose or falls off after 4 days call [employer] at [phone] for  suggestions on securing your monitor      You are scheduled for Cardiac MRI on ______________. Please arrive for your appointment at ______________ ( arrive 30-45 minutes prior to test start time). ?  [facility] [address] [place], [place] [address] [phone] Please take advantage of the free valet parking available at the MAIN entrance (A entrance).  Proceed to the [facility] Radiology Department (First Floor) for check-in.   OR   [facility] [address] [place], [place] [address] [phone] Please take advantage of the  free valet parking available at the MAIN entrance. Proceed to [facility] registration for check-in (first floor).  Magnetic resonance imaging (MRI) is a painless test that produces images of the inside of the body without using Xrays.  During an MRI, strong magnets and radio waves work together in a Data processing manager to form detailed images.   MRI images may provide more details about a medical condition than X-rays, CT scans, and ultrasounds can provide.  You may be given earphones to listen for instructions.  You may eat a light breakfast and take medications as ordered with the exception of furosemide, hydrochlorothiazide, or spironolactone(fluid pill, other). Please avoid stimulants for 12 hr prior to test. (Ie. Caffeine, nicotine, chocolate, or antihistamine medications)  If a contrast material will be used, an IV will be inserted into one of your veins. Contrast material will be injected into your IV. It will leave your body through your urine within a day. You may be told to drink plenty of fluids to help flush the contrast material out of your system.  You will be asked to remove  all metal, including: Watch, jewelry, and other metal objects including hearing aids, hair pieces and dentures. Also wearable glucose monitoring systems (ie. Freestyle Libre and Omnipods) (Braces and fillings normally are not a problem.)   TEST WILL TAKE APPROXIMATELY 1 HOUR  PLEASE NOTIFY SCHEDULING AT LEAST 24 HOURS IN ADVANCE IF YOU ARE UNABLE TO KEEP YOUR APPOINTMENT. [phone]  Please call Rockwell Alexandria, cardiac imaging nurse navigator with any questions/concerns. Rockwell Alexandria RN Navigator Cardiac Imaging Larey Brick RN Navigator Cardiac Imaging [facility] [phone] Office

## 2022-11-15 NOTE — Progress Notes (Unsigned)
Enrolled for [employer] to mail a ZIO XT long term holter monitor to the patients address on file.  

## 2022-11-15 NOTE — Progress Notes (Signed)
Cardiology Office Note:    Date:  11/15/2022   ID:  Laura Marshall, DOB 1966/03/09, MRN [id number]  PCP:  Lorenda Ishihara, MD   [facility] Providers Cardiologist:  None     Referring MD: Orland Mustard, MD   CC: HCM Consulted for the evaluation of oHCM at the behest of Dr. Eldridge Dace  History of Present Illness:    Laura Marshall is a 57 y.o. female with a hx of HLD with DM.  She has oHCM seen on 10/21/2022 echo.  Patient feels well.  Patient notes no SOB at rest and no DOE. Notes no fatigue. Notes rare palpitations Notes no CP. Notes no Dizziness. Notes no syncope.  Works out on her treadmill: she did couch to 5 k.  She is currently golfing; she has some concerns about working out more thank this.    She has been sober for over two years; and her palpitations have greatly improved.   Has previously tried therapies, she was diagnosed on May 3rd.  First murmur was found in college.   Notable family events include no family members. In 2019- he had an LVAD related to ischemic cardiomyopathy M GF Had multiple heart attacks at 73 She has 2 brothers to 2 daughters. Maternal Aunt died suddenly at the age 71.   Past Medical History:  Diagnosis Date   Anxiety    Depression    Family history of breast cancer    Family history of colon cancer    Family history of coronary arteriosclerosis    Family history of ovarian cancer    Family history of prostate cancer    Family history of stomach cancer    Hashimoto's thyroiditis    Heart murmur    Herpes simplex    Hyperlipidemia    Palpitations    Papanicolaou smear of cervix with atypical squamous cells of undetermined significance (ASC-US)    Thyroid nodule    Type 2 diabetes mellitus (HCC)    Vaginal lesion    Vulvitis     Past Surgical History:  Procedure Laterality Date   AUGMENTATION MAMMAPLASTY Bilateral     Current Medications: Current Meds  Medication Sig   ARMOUR THYROID 15 MG tablet  daily.   hydrochlorothiazide (HYDRODIURIL) 12.5 MG tablet Take 12.5 mg by mouth daily.   ibuprofen (ADVIL) 800 MG tablet as needed for headache or moderate pain.   Prenatal Multivit-Min-Fe-FA (PRENATAL VITAMINS PO) Take 1 tablet by mouth daily.   Pseudoephedrine HCl (SUDAFED PO) Take by mouth as needed.   rosuvastatin (CRESTOR) 5 MG tablet Take 5 mg by mouth daily.   tretinoin (RETIN-A) 0.025 % cream 3 (three) times a week.     Allergies:   Animator, Latex, Minocin [minocycline hcl], Monosodium glutamate, and Sulfamethoxazole-trimethoprim   Social History   Socioeconomic History   Marital status: Married    Spouse name: Not on file   Number of children: Not on file   Years of education: Not on file   Highest education level: Not on file  Occupational History   Not on file  Tobacco Use   Smoking status: Never   Smokeless tobacco: Never  Substance and Sexual Activity   Alcohol use: Yes    Alcohol/week: 2.0 - 3.0 standard drinks of alcohol    Types: 2 - 3 Glasses of wine per week   Drug use: No   Sexual activity: Not on file  Other Topics Concern   Not on file  Social History  Narrative   Not on file   Social Determinants of Health   Financial Resource Strain: Not on file  Food Insecurity: Not on file  Transportation Needs: Not on file  Physical Activity: Not on file  Stress: Not on file  Social Connections: Not on file     Family History: The patient's family history includes Breast cancer in her cousin, paternal grandmother, and another family member; Breast cancer (age of onset: 44) in an other family member; Breast cancer (age of onset: 37) in an other family member; CAD in her father, maternal grandfather, and another family member; Cancer in her paternal aunt; Cancer (age of onset: 33) in her mother; Colon cancer in her cousin and another family member; Congenital heart disease in her maternal aunt; Congestive Heart Failure in her father and maternal grandfather;  Diabetes in her brother, daughter, father, and paternal aunt; Diabetes type I in her daughter; Kidney Stones in her brother; Liver cancer in her paternal aunt; Meniere's disease in her brother; Other in her daughter; Ovarian cancer in her maternal aunt; Ovarian cancer (age of onset: 67) in her maternal aunt; Prostate cancer (age of onset: 37) in her father; Stomach cancer (age of onset: 18) in her maternal grandmother; Valvular heart disease in her maternal grandfather.  ROS:   Please see the history of present illness.     All other systems reviewed and are negative.  EKGs/Labs/Other Studies Reviewed:    The following studies were reviewed today:  EKG:  EKG is  ordered today.  The ekg ordered today demonstrates  11/15/22: Sinus bradycardia 57 bradycardia   Cardiac Studies & Procedures       ECHOCARDIOGRAM  ECHOCARDIOGRAM COMPLETE 10/21/2022  Narrative ECHOCARDIOGRAM REPORT    Patient Name:   Laura Marshall Date of Exam: 10/21/2022 Medical Rec #:  [id number]         Height:       63.0 in Accession #:    [id number]        Weight:       137.0 lb Date of Birth:  27-Aug-1965         BSA:          1.646 m Patient Age:    57 years          BP:           140/79 mmHg Patient Gender: F                 HR:           67 bpm. Exam Location:  [address]  Procedure: 2D Echo, Cardiac Doppler and Color Doppler  Indications:    R01.1 Murmur  History:        Patient has no prior history of Echocardiogram examinations. Signs/Symptoms:Murmur; Risk Factors:Family History of Coronary Artery Disease, Diabetes and Dyslipidemia. Bilateral Breast Augmentation.  Sonographer:    Farrel Conners RDCS Referring Phys: Orland Mustard  IMPRESSIONS   1. Severe asymmetric hypertrophy of the septum up to 1.7 cm. There is SAM of the MV with evidence of LVOT obstruction ~90 mmHG at rest. There is mild posteriorly directed mitral regurgitation. With valsalva this increases to 170 mmHG, but difficult  to distinguish between MR signal. Overall, findings are concerning for hypertrophic obstructive cardiomyopathy. Would recommend cardiac MRI for further evaluation and cardiology consultation. Left ventricular ejection fraction, by estimation, is 70 to 75%. The left ventricle has hyperdynamic function. The left ventricle has no regional wall motion abnormalities. There is  severe asymmetric left ventricular hypertrophy of the septal segment. Left ventricular diastolic parameters are consistent with Grade I diastolic dysfunction (impaired relaxation). 2. Right ventricular systolic function is normal. The right ventricular size is normal. There is normal pulmonary artery systolic pressure. The estimated right ventricular systolic pressure is 25.5 mmHg. 3. The mitral valve is grossly normal. Mild mitral valve regurgitation. No evidence of mitral stenosis. 4. The aortic valve is tricuspid. There is mild calcification of the aortic valve. Aortic valve regurgitation is not visualized. Aortic valve sclerosis is present, with no evidence of aortic valve stenosis. 5. There is mild dilatation of the ascending aorta, measuring 40 mm. 6. The inferior vena cava is normal in size with greater than 50% respiratory variability, suggesting right atrial pressure of 3 mmHg.  FINDINGS Left Ventricle: Severe asymmetric hypertrophy of the septum up to 1.7 cm. There is SAM of the MV with evidence of LVOT obstruction ~90 mmHG at rest. There is mild posteriorly directed mitral regurgitation. With valsalva this increases to 170 mmHG, but difficult to distinguish between MR signal. Overall, findings are concerning for hypertrophic obstructive cardiomyopathy. Would recommend cardiac MRI for further evaluation and cardiology consultation. Left ventricular ejection fraction, by estimation, is 70 to 75%. The left ventricle has hyperdynamic function. The left ventricle has no regional wall motion abnormalities. The left ventricular  internal cavity size was normal in size. There is severe asymmetric left ventricular hypertrophy of the septal segment. Left ventricular diastolic parameters are consistent with Grade I diastolic dysfunction (impaired relaxation).  Right Ventricle: The right ventricular size is normal. No increase in right ventricular wall thickness. Right ventricular systolic function is normal. There is normal pulmonary artery systolic pressure. The tricuspid regurgitant velocity is 2.37 m/s, and with an assumed right atrial pressure of 3 mmHg, the estimated right ventricular systolic pressure is 25.5 mmHg.  Left Atrium: Left atrial size was normal in size.  Right Atrium: Right atrial size was normal in size.  Pericardium: There is no evidence of pericardial effusion.  Mitral Valve: The mitral valve is grossly normal. Mild mitral valve regurgitation. No evidence of mitral valve stenosis.  Tricuspid Valve: The tricuspid valve is grossly normal. Tricuspid valve regurgitation is trivial. No evidence of tricuspid stenosis.  Aortic Valve: The aortic valve is tricuspid. There is mild calcification of the aortic valve. Aortic valve regurgitation is not visualized. Aortic valve sclerosis is present, with no evidence of aortic valve stenosis. Aortic valve mean gradient measures 10.0 mmHg. Aortic valve peak gradient measures 17.9 mmHg.  Pulmonic Valve: The pulmonic valve was grossly normal. Pulmonic valve regurgitation is trivial. No evidence of pulmonic stenosis.  Aorta: The aortic root is normal in size and structure. There is mild dilatation of the ascending aorta, measuring 40 mm.  Venous: The inferior vena cava is normal in size with greater than 50% respiratory variability, suggesting right atrial pressure of 3 mmHg.  IAS/Shunts: The atrial septum is grossly normal.   LEFT VENTRICLE PLAX 2D LVIDd:         4.10 cm   Diastology LVIDs:         2.20 cm   LV e' medial:    7.62 cm/s LV PW:         0.90 cm    LV E/e' medial:  10.2 LV IVS:        1.67 cm   LV e' lateral:   9.94 cm/s LVOT diam:     2.20 cm   LV E/e' lateral: 7.8 LVOT Area:  3.80 cm   RIGHT VENTRICLE RV Basal diam:  3.60 cm RV S prime:     13.10 cm/s TAPSE (M-mode): 1.9 cm RVSP:           25.5 mmHg  LEFT ATRIUM             Index        RIGHT ATRIUM           Index LA diam:        3.90 cm 2.37 cm/m   RA Pressure: 3.00 mmHg LA Vol (A2C):   52.6 ml 31.95 ml/m  RA Area:     13.70 cm LA Vol (A4C):   56.0 ml 34.01 ml/m  RA Volume:   37.60 ml  22.84 ml/m LA Biplane Vol: 55.2 ml 33.53 ml/m AORTIC VALVE AV Vmax:      211.25 cm/s AV Vmean:     146.250 cm/s AV VTI:       0.460 m AV Peak Grad: 17.9 mmHg AV Mean Grad: 10.0 mmHg  AORTA Ao Root diam: 3.00 cm Ao Asc diam:  3.95 cm  MITRAL VALVE               TRICUSPID VALVE MV Area (PHT)  cm         TR Peak grad:   22.5 mmHg MV Decel Time: 264 msec    TR Vmax:        237.00 cm/s MR Peak grad: 165.9 mmHg   Estimated RAP:  3.00 mmHg MR Mean grad: 113.0 mmHg   RVSP:           25.5 mmHg MR Vmax:      644.00 cm/s MR Vmean:     505.0 cm/s   SHUNTS MV E velocity: 77.55 cm/s  Systemic Diam: 2.20 cm MV A velocity: 82.05 cm/s MV E/A ratio:  0.95  Lennie Odor MD Electronically signed by Lennie Odor MD Signature Date/Time: 10/21/2022/11:41:05 PM    Final              Recent Labs: No results found for requested labs within last 365 days.  Recent Lipid Panel No results found for: "CHOL", "TRIG", "HDL", "CHOLHDL", "VLDL", "LDLCALC", "LDLDIRECT"       Physical Exam:    VS:  BP 116/60   Pulse (!) 57   Ht 5\' 3"  (1.6 m)   Wt 146 lb (66.2 kg)   SpO2 100%   BMI 25.86 kg/m     Wt Readings from Last 3 Encounters:  11/15/22 146 lb (66.2 kg)  07/18/16 137 lb (62.1 kg)    GEN:  Well nourished, well developed in no acute distress HEENT: Normal NECK: No JVD; No carotid bruits CARDIAC: RRR, no rubs, gallops, systolic murmur RESPIRATORY:  Clear to auscultation  without rales, wheezing or rhonchi  ABDOMEN: Soft, non-tender, non-distended MUSCULOSKELETAL:  No edema; No deformity  SKIN: Warm and dry NEUROLOGIC:  Alert and oriented x 3 PSYCHIATRIC:  Normal affect   ASSESSMENT:    1. Hypertrophic cardiomyopathy (HCC)   2. Palpitations   3. Bradycardia   4. Aortic atherosclerosis (HCC)   5. Mixed hyperlipidemia    PLAN:    Hypertrophic Cardiomyopathy  - Likely septal Variant - resting gradient of 90 mm Hg at rest; I am uable to clearly define her peak gradient due to aliasing from her mitral regurgitation - suspicion of Fabry's/Danon or other mimics of HCM: none - Gene variant: NA  - NYHA I - Non HCM Contributors to disease/status -  she has HTN in setting of prior alcohol consumption; will trial off HCTZ and see how gradient and symptoms go; she will check AMB BP at home  Family history Reviewed, Discussed family screening  (She has a 9 year old daughter who needs close interval imaging, a 58 year old daugther who need 5 year screening, two brothers who need screening; she would be appropriate for genetic testing)  SCD  Assessment - will get POET and CMR - based on this will estimate SCD risk, if Class Iib or lower ICD indication we will likely defer at this time;   Atrial fibrillation not known; she has rare palpitations and sinus bradycardia - Atrial arrhythmia management: monitoring via ziopatch  Medication symptom plan - if gradient or sx worsen off diuretic we will try low dose BB (25 mg PO daily of succinate) if unable to tolerated would discussed CMI trial vs SRT  Fall f/u unless high risk findings or new symptoms.  Non HCM cardiac care HLD and aortic atherosclerosis (05/07/2024) CT- LDL goal < 70; will continue statin; in the future we will work to decrease her LDL below 98 HTN- as above  Mild aortic dilation- will clarify with CMR  Time Spent Directly with Patient:   I have spent a total of 61 minutes with the patient  reviewing notes, imaging, EKGs, labs and examining the patient as well as establishing an assessment and plan that was discussed personally with the patient.  > 50% of time was spent in direct patient care and family and reviewing imaging with patient.         Medication Adjustments/Labs and Tests Ordered: Current medicines are reviewed at length with the patient today.  Concerns regarding medicines are outlined above.  Orders Placed This Encounter  Procedures   MR CARDIAC MORPHOLOGY W WO CONTRAST   CBC   LONG TERM MONITOR (3-14 DAYS)   EXERCISE TOLERANCE TEST (ETT)   EKG 12-Lead   No orders of the defined types were placed in this encounter.   Patient Instructions  Medication Instructions:  Your physician recommends that you continue on your current medications as directed. Please refer to the Current Medication list given to you today.  *If you need a refill on your cardiac medications before your next appointment, please call your pharmacy*   Lab Work: TODAY: CBC  If you have labs (blood work) drawn today and your tests are completely normal, you will receive your results only by: MyChart Message (if you have MyChart) OR A paper copy in the mail If you have any lab test that is abnormal or we need to change your treatment, we will call you to review the results.   Testing/Procedures: Your physician has requested that you have a cardiac MRI. Cardiac MRI uses a computer to create images of your heart as its beating, producing both still and moving pictures of your heart and major blood vessels. For further information please visit [email or web address]. Please follow the instruction sheet given to you today for more information.   Your physician has requested that you have an exercise tolerance test. For further information please visit [email or web address] Please also follow instruction sheet, as given.    Your physician has requested that you wear a heart  monitor.   Follow-Up: At [facility], you and your health needs are our priority.  As part of our continuing mission to provide you with exceptional heart care, we have created designated Provider Care Teams.  These Care Teams include your  primary Cardiologist (physician) and Advanced Practice Providers (APPs -  Physician Assistants and Nurse Practitioners) who all work together to provide you with the care you need, when you need it.   Your next appointment:   5-6 month(s)  Provider:   Riley Lam, MD  Other Instructions Christena Deem- Long Term Monitor Instructions  Your physician has requested you wear a ZIO patch monitor for 7 days.  This is a single patch monitor. [employer] supplies one patch monitor per enrollment. Additional stickers are not available. Please do not apply patch if you will be having a Nuclear Stress Test,  Echocardiogram, Cardiac CT, MRI, or Chest Xray during the period you would be wearing the  monitor. The patch cannot be worn during these tests. You cannot remove and re-apply the  ZIO XT patch monitor.  Your ZIO patch monitor will be mailed 3 day USPS to your address on file. It may take 3-5 days  to receive your monitor after you have been enrolled.  Once you have received your monitor, please review the enclosed instructions. Your monitor  has already been registered assigning a specific monitor serial # to you.  Billing and Patient Assistance Program Information  We have supplied [employer] with any of your insurance information on file for billing purposes. [employer] offers a sliding scale Patient Assistance Program for patients that do not have  insurance, or whose insurance does not completely cover the cost of the ZIO monitor.  You must apply for the Patient Assistance Program to qualify for this discounted rate.  To apply, please call [employer] at [phone], select option 4, select option 2, ask to apply for  Patient Assistance Program.  Meredeth Ide will ask your household income, and how many people  are in your household. They will quote your out-of-pocket cost based on that information.  [employer] will also be able to set up a 78-month, interest-free payment plan if needed.  Applying the monitor   Shave hair from upper left chest.  Hold abrader disc by orange tab. Rub abrader in 40 strokes over the upper left chest as  indicated in your monitor instructions.  Clean area with 4 enclosed alcohol pads. Let dry.  Apply patch as indicated in monitor instructions. Patch will be placed under collarbone on left  side of chest with arrow pointing upward.  Rub patch adhesive wings for 2 minutes. Remove white label marked "1". Remove the white  label marked "2". Rub patch adhesive wings for 2 additional minutes.  While looking in a mirror, press and release button in center of patch. A small green light will  flash 3-4 times. This will be your only indicator that the monitor has been turned on.  Do not shower for the first 24 hours. You may shower after the first 24 hours.  Press the button if you feel a symptom. You will hear a small click. Record Date, Time and  Symptom in the Patient Logbook.  When you are ready to remove the patch, follow instructions on the last 2 pages of Patient  Logbook. Stick patch monitor onto the last page of Patient Logbook.  Place Patient Logbook in the blue and white box. Use locking tab on box and tape box closed  securely. The blue and white box has prepaid postage on it. Please place it in the mailbox as  soon as possible. Your physician should have your test results approximately 7 days after the  monitor has been mailed back to [facility].  Call [employer] Customer Care at [phone] if you have questions regarding  your ZIO XT patch monitor. Call them immediately if you see an orange light blinking on your  monitor.  If your monitor falls off in less than 4 days, contact our Monitor  department at [phone].  If your monitor becomes loose or falls off after 4 days call [employer] at [phone] for  suggestions on securing your monitor      You are scheduled for Cardiac MRI on ______________. Please arrive for your appointment at ______________ ( arrive 30-45 minutes prior to test start time). ?  [facility] [address] [place], [place] [address] [phone] Please take advantage of the free valet parking available at the MAIN entrance (A entrance).  Proceed to the [facility] Radiology Department (First Floor) for check-in.   OR   [facility] [address] [place], [place] [address] [phone] Please take advantage of the free valet parking available at the MAIN entrance. Proceed to [facility] registration for check-in (first floor).  Magnetic resonance imaging (MRI) is a painless test that produces images of the inside of the body without using Xrays.  During an MRI, strong magnets and radio waves work together in a Data processing manager to form detailed images.   MRI images may provide more details about a medical condition than X-rays, CT scans, and ultrasounds can provide.  You may be given earphones to listen for instructions.  You may eat a light breakfast and take medications as ordered with the exception of furosemide, hydrochlorothiazide, or spironolactone(fluid pill, other). Please avoid stimulants for 12 hr prior to test. (Ie. Caffeine, nicotine, chocolate, or antihistamine medications)  If a contrast material will be used, an IV will be inserted into one of your veins. Contrast material will be injected into your IV. It will leave your body through your urine within a day. You may be told to drink plenty of fluids to help flush the contrast material out of your system.  You will be asked to remove all metal, including: Watch, jewelry, and other metal objects including hearing aids, hair pieces and dentures. Also  wearable glucose monitoring systems (ie. Freestyle Libre and Omnipods) (Braces and fillings normally are not a problem.)   TEST WILL TAKE APPROXIMATELY 1 HOUR  PLEASE NOTIFY SCHEDULING AT LEAST 24 HOURS IN ADVANCE IF YOU ARE UNABLE TO KEEP YOUR APPOINTMENT. [phone]  Please call Rockwell Alexandria, cardiac imaging nurse navigator with any questions/concerns. Rockwell Alexandria RN Navigator Cardiac Imaging Larey Brick RN Navigator Cardiac Imaging [facility] [phone] Office     Signed, Christell Constant, MD  11/15/2022 2:17 PM    [facility]

## 2022-11-16 ENCOUNTER — Ambulatory Visit: Payer: BC Managed Care – PPO | Attending: Internal Medicine

## 2022-11-16 DIAGNOSIS — I422 Other hypertrophic cardiomyopathy: Secondary | ICD-10-CM

## 2022-11-16 LAB — CBC
Hematocrit: 41 % (ref 34.0–46.6)
Hemoglobin: 14.2 g/dL (ref 11.1–15.9)
MCH: 31.3 pg (ref 26.6–33.0)
MCHC: 34.6 g/dL (ref 31.5–35.7)
MCV: 90 fL (ref 79–97)
Platelets: 217 10*3/uL (ref 150–450)
RBC: 4.54 x10E6/uL (ref 3.77–5.28)
RDW: 12.1 % (ref 11.7–15.4)
WBC: 5.2 10*3/uL (ref 3.4–10.8)

## 2022-11-16 LAB — EXERCISE TOLERANCE TEST
Angina Index: 0
Duke Treadmill Score: 10
Estimated workload: 11.7
Exercise duration (min): 10 min
Exercise duration (sec): 0 s
MPHR: 162 {beats}/min
Peak HR: 146 {beats}/min
Percent HR: 90 %
RPE: 15
Rest HR: 59 {beats}/min
ST Depression (mm): 0 mm

## 2022-11-20 NOTE — Progress Notes (Signed)
Pre Test Genetic Consult  Referral Reason  Laura Marshall is referred for genetic consult and testing of hypertrophic cardiomyopathy.   Personal Medical Information Laura Marshall (III.1 on pedigree), a 57 year old Caucasian lady is here today with her husband. She tells me that in March 2024 while being evaluated by her PCP for a sinus infection, she was found to have a heart murmur. Laura Marshall tells me that she was told of having a heart murmur in college but it was pronounced at this visit. She had an echocardiogram that detected cardiac wall thickness suggestive of HCM with IVS of 1.7 cm, LVEF 70%-75%, SAM and LVOT obstruction. Her echocardiogram in 2011 was normal.  She states that she is mostly asymptomatic and denies having dyspnea, chest discomfort, heart palpitations, dizziness or syncope.  Traditional Risk Factors Laura Marshall was found to have HTN  3 years ago that was been well-managed with medication   Family history  Relation to Proband Pedigree # Current age Heart condition/age of onset Notes  Daughters, 2 IV.1, IV.2 27, 16 None Echo/EKG not done  Brothers, 2 III.2, III.3 56, 53 None III.1- Echo/EKG to be done III.2- Normal echo @ 51  Nephew, nieces IV.3, IV.4-IV.5 IV.3- Deceased Fraternal twins-17 None IV.3- suicide @ 15 IV.4- cerebral palsy        Father II.4 Deceased 2x M.I., 3 vessel CABG @ 60s LVAD @ [facility] HF @ 69 Died @ 79- heart failure   Paternal aunts, 3 II.1, II.3 Deceased None II.1- Died of diabetes complications @ 70 II.2-Died @ 60-cancer II.3- died in sleep @ 24- no prior heart issues, poor health  Paternal grandfather 1.1 Deceased None Died @ 75-cardiac issues?  Paternal grandmother I.2 Deceased None Died @ 50-breast cancer        Mother II.5 Deceased  Died @ 50- leiomyosarcoma  Maternal Uncle, Aunts II.6-II.8 II.6, II.7-Deceased (identical twins) 8 Cardiac issues in II.6- details? II.6- Died during cardiac surgery @ 01-04-2023- details? II.7- Died of ovarian cancer @ 74s    Maternal grandfather I.3 Deceased M.I. @ 57 3x M.I prior to age 64 Died of 78-poor health  Maternal grandmother I.4 Deceased None Died @ 48- stomach cancer   Genetics Laura Marshall was counseled on the genetics of hypertrophic cardiomyopathy (HCM). I explained to the patient that this is an autosomal dominant condition with incomplete penetrance i.e. not all individuals harboring the HCM mutation will present clinically with HCM, and age-related penetrance where clinical presentation of HCM increases with advanced age. Variability in clinical expression is also seen in families with HCM with affected family members presenting clinically at different ages and with symptoms ranging from mild to severe.  Since HCM is an autosomal dominant condition, first degree-relatives should seek regular surveillance for HCM.  First-degree relatives include her two daughters and brothers. Clinical screening of first-degree relatives involves echocardiogram and EKG at regular intervals, frequency is typically determined by age, with those in their teens undergoing screening every year and those over the age of 61 getting screened every 3-5 years until the age of 24. Patient verbalized understanding of this.  I informed the patient that about 8-10% of HCM patients can have compound and digenic mutations for HCM. Also briefly discussed the inheritance pattern and treatment /management plans for the infiltrative cardiomyopathies that present as HCM phenocopies.   We walked through the process of genetic testing. I explained to the patient that genetic testing is a probabilistic test dependent upon age and severity of presentation, presence of risk factors for  HCM and importantly family history of HCM or sudden death in first-degree relatives.   The potential outcomes of genetic testing and subsequent management of at-risk family members were discussed so as to manage expectations-  If a mutation is not identified, then all  first-degree relatives should undergo regular screening for HCM. I emphasized that even if the genetic test is negative, it does not mean that he does not have HCM. A negative test result can be due to limitations of the genetic test. Patient verbalized understanding of this.  There is also the likelihood of identifying a "Variant of unknown significance". This result means that the variant has not been detected in a statistically significant number of HCM patients and/or functional studies have not been performed to verify its pathogenicity. This VUS can be tested in the family to see if it segregates with disease. If a VUS is found, first-degree relatives should undergo regular clinical screening for HCM.  If a pathogenic variant is reported, then first-degree family members can get tested for this variant. If they test positive, it is likely they will develop HCM. In light of variable expression and incomplete penetrance associated with HCM, it is not possible to predict when they will manifest clinically with HCM. It is recommended that family members that test positive for the familial pathogenic variant pursue clinical screening for HCM. Family members that test negative for the familial mutation need not pursue periodic screening for HCM, but seek care if symptoms develop.   Impression  Laura Marshall was found to have cardiac wall thickness suggestive of HCM at age 44 in the absence of risk factors. Additionally, she reports heart failure in her dad, though his underlying CAD may be causal to his condition. Also reports sudden death, although at age 70, in a paternal aunt.   Genetic testing is recommended to confirm her diagnosis and perform cascade screening of her first-degree relatives to determine their genotype. This test should include the major sarcomeric genes involved in HCM, namely MYBPPC3, MYH7, TNNI3, TNNT2, TPM1, ACTC1, MYL2 and MYL3. It should also include the genes involved in HCM phenocopies  as cardiac-predominant forms of these conditions present clinically as HCM. These include genes for Fabry disease (GLA), Danon disease (LAMP2), WPW syndrome (PRKAG2), Familial transthyretin amyloidosis (TTR) and phosholamban.  In addition, we discussed the protections afforded by the Genetic Information Non-Discrimination Act (GINA). I explained to the patient that GINA protects them from losing their employment or health insurance based on their genotype. However, these protections do not cover life insurance and disability. Patient verbalized understanding of this and states that her daughters need to obtain life insurance.  Please note that the patient has not been counseled in this visit on other personal, cultural or ethical issues that she may face due to her heart condition.   Plan After a thorough discussion of the risk and benefits of genetic testing for HCM, Laura Marshall declines genetic testing for HCM as she would like to give this further thought. She will reach back to [place] when she is ready to pursue genetic testing for HCM.   Sidney Ace, Ph.D, [facility] Clinical Molecular Geneticist

## 2022-11-24 DIAGNOSIS — R002 Palpitations: Secondary | ICD-10-CM | POA: Diagnosis not present

## 2022-11-24 DIAGNOSIS — I422 Other hypertrophic cardiomyopathy: Secondary | ICD-10-CM | POA: Diagnosis not present

## 2022-11-24 DIAGNOSIS — R001 Bradycardia, unspecified: Secondary | ICD-10-CM | POA: Diagnosis not present

## 2023-01-02 ENCOUNTER — Encounter: Payer: Self-pay | Admitting: Internal Medicine

## 2023-01-26 ENCOUNTER — Telehealth (HOSPITAL_COMMUNITY): Payer: Self-pay | Admitting: Emergency Medicine

## 2023-01-26 NOTE — Telephone Encounter (Signed)
Unable to leave vm Cam Harnden RN Navigator Cardiac Imaging [facility] [phone] Office  [phone] Cell  

## 2023-01-30 ENCOUNTER — Ambulatory Visit (HOSPITAL_COMMUNITY): Admission: RE | Admit: 2023-01-30 | Payer: BC Managed Care – PPO | Source: Ambulatory Visit

## 2023-02-09 DIAGNOSIS — D225 Melanocytic nevi of trunk: Secondary | ICD-10-CM | POA: Diagnosis not present

## 2023-02-09 DIAGNOSIS — L821 Other seborrheic keratosis: Secondary | ICD-10-CM | POA: Diagnosis not present

## 2023-02-09 DIAGNOSIS — Z86018 Personal history of other benign neoplasm: Secondary | ICD-10-CM | POA: Diagnosis not present

## 2023-02-09 DIAGNOSIS — L814 Other melanin hyperpigmentation: Secondary | ICD-10-CM | POA: Diagnosis not present

## 2023-02-09 DIAGNOSIS — Z85828 Personal history of other malignant neoplasm of skin: Secondary | ICD-10-CM | POA: Diagnosis not present

## 2023-02-09 DIAGNOSIS — D485 Neoplasm of uncertain behavior of skin: Secondary | ICD-10-CM | POA: Diagnosis not present

## 2023-04-07 DIAGNOSIS — E1165 Type 2 diabetes mellitus with hyperglycemia: Secondary | ICD-10-CM | POA: Diagnosis not present

## 2023-04-07 DIAGNOSIS — E039 Hypothyroidism, unspecified: Secondary | ICD-10-CM | POA: Diagnosis not present

## 2023-04-07 DIAGNOSIS — E7800 Pure hypercholesterolemia, unspecified: Secondary | ICD-10-CM | POA: Diagnosis not present

## 2023-04-13 ENCOUNTER — Ambulatory Visit
Admission: RE | Admit: 2023-04-13 | Discharge: 2023-04-13 | Disposition: A | Discharge status: Home / Self Care | Payer: BC Managed Care – PPO | Source: Ambulatory Visit | Attending: Family Medicine | Admitting: Family Medicine

## 2023-04-13 ENCOUNTER — Other Ambulatory Visit: Payer: Self-pay | Admitting: Family Medicine

## 2023-04-13 DIAGNOSIS — M549 Dorsalgia, unspecified: Secondary | ICD-10-CM | POA: Diagnosis not present

## 2023-04-13 DIAGNOSIS — M25552 Pain in left hip: Secondary | ICD-10-CM

## 2023-04-13 DIAGNOSIS — M545 Low back pain, unspecified: Secondary | ICD-10-CM

## 2023-04-13 DIAGNOSIS — M160 Bilateral primary osteoarthritis of hip: Secondary | ICD-10-CM | POA: Diagnosis not present

## 2023-04-14 ENCOUNTER — Other Ambulatory Visit: Payer: Self-pay | Admitting: Endocrinology

## 2023-04-14 DIAGNOSIS — E039 Hypothyroidism, unspecified: Secondary | ICD-10-CM | POA: Diagnosis not present

## 2023-04-14 DIAGNOSIS — E049 Nontoxic goiter, unspecified: Secondary | ICD-10-CM | POA: Diagnosis not present

## 2023-04-14 DIAGNOSIS — E7800 Pure hypercholesterolemia, unspecified: Secondary | ICD-10-CM | POA: Diagnosis not present

## 2023-04-14 DIAGNOSIS — E1165 Type 2 diabetes mellitus with hyperglycemia: Secondary | ICD-10-CM | POA: Diagnosis not present

## 2023-04-14 DIAGNOSIS — E041 Nontoxic single thyroid nodule: Secondary | ICD-10-CM

## 2023-04-17 ENCOUNTER — Ambulatory Visit
Admission: RE | Admit: 2023-04-17 | Discharge: 2023-04-17 | Disposition: A | Discharge status: Home / Self Care | Payer: BC Managed Care – PPO | Source: Ambulatory Visit | Attending: Endocrinology | Admitting: Endocrinology

## 2023-04-17 DIAGNOSIS — E042 Nontoxic multinodular goiter: Secondary | ICD-10-CM | POA: Diagnosis not present

## 2023-04-17 DIAGNOSIS — E041 Nontoxic single thyroid nodule: Secondary | ICD-10-CM

## 2023-05-05 ENCOUNTER — Ambulatory Visit: Payer: BC Managed Care – PPO | Admitting: Internal Medicine

## 2023-08-17 ENCOUNTER — Telehealth: Payer: Self-pay

## 2023-08-17 DIAGNOSIS — I422 Other hypertrophic cardiomyopathy: Secondary | ICD-10-CM

## 2023-08-17 NOTE — Telephone Encounter (Signed)
 Called pt scheduled echocardiogram for 09/05/23 at 7:35 am.  OV with MD scheduled for 09/11/23 at 10 am.

## 2023-08-17 NOTE — Telephone Encounter (Signed)
-----   Message from Christell Constant sent at 08/17/2023  9:15 AM EST ----- Regarding: New Chest pain Patient notes that she is doing poorly.   Since last visit notes that she had new exertional CP and SOB . There are no interval hospital/ED visit.   She is getting over the flu.  No chest pain or pressure at rest.  No SOB at rest and no PND/Orthopnea.  No weight gain or leg swelling.  No palpitations or syncope.  Discussed with her PCP. - ischemia vs oHCM - discussed red flag symptoms - order echocardiogram  - will overbook her to 09/11/23 - if escalation of symptoms, ED eval is appropriate  Riley Lam, MD FASE [facility] Cardiologist [facility]  [address], [address] [place], [place] [address] [phone]  9:18 AM

## 2023-08-29 DIAGNOSIS — Z1331 Encounter for screening for depression: Secondary | ICD-10-CM | POA: Diagnosis not present

## 2023-08-29 DIAGNOSIS — Z124 Encounter for screening for malignant neoplasm of cervix: Secondary | ICD-10-CM | POA: Diagnosis not present

## 2023-08-29 DIAGNOSIS — Z01419 Encounter for gynecological examination (general) (routine) without abnormal findings: Secondary | ICD-10-CM | POA: Diagnosis not present

## 2023-09-05 ENCOUNTER — Ambulatory Visit (HOSPITAL_COMMUNITY): Payer: BC Managed Care – PPO | Attending: Internal Medicine

## 2023-09-05 DIAGNOSIS — I422 Other hypertrophic cardiomyopathy: Secondary | ICD-10-CM | POA: Insufficient documentation

## 2023-09-05 LAB — ECHOCARDIOGRAM COMPLETE
Area-P 1/2: 3.98 cm2
S' Lateral: 1.8 cm

## 2023-09-06 ENCOUNTER — Encounter: Payer: Self-pay | Admitting: Internal Medicine

## 2023-09-11 ENCOUNTER — Ambulatory Visit: Payer: BC Managed Care – PPO | Admitting: Internal Medicine

## 2023-09-11 ENCOUNTER — Ambulatory Visit: Attending: Internal Medicine | Admitting: Internal Medicine

## 2023-09-11 ENCOUNTER — Encounter: Payer: Self-pay | Admitting: Internal Medicine

## 2023-09-11 VITALS — BP 118/76 | HR 61 | Ht 62.75 in | Wt 145.6 lb

## 2023-09-11 DIAGNOSIS — E782 Mixed hyperlipidemia: Secondary | ICD-10-CM | POA: Diagnosis not present

## 2023-09-11 DIAGNOSIS — I422 Other hypertrophic cardiomyopathy: Secondary | ICD-10-CM | POA: Diagnosis not present

## 2023-09-11 DIAGNOSIS — I77810 Thoracic aortic ectasia: Secondary | ICD-10-CM | POA: Insufficient documentation

## 2023-09-11 NOTE — Progress Notes (Signed)
 Cardiology Office Note:  .    Date:  09/11/2023  ID:  Laura Marshall, DOB 06-07-1966, MRN [id number] PCP: Orland Mustard, MD  [facility] Providers Cardiologist:  None     CC: DOD chest pain  History of Present Illness: .    Laura Marshall is a 58 y.o. female with a hx of HLD and DM.   2019 saw JV- atypical CP 2024: Establish with me.  Asymptomatic- severe LVOT gradient (resolved by stopping hydrochlorothiazide).  Did not pursue ordered CMR. Rare, asymptomatic paroxsymal SVT on heart monitor  Laura Marshall is a 58 year old female with obstructive hypertrophic cardiomyopathy who presents with shortness of breath and a burning sensation post-flu.  She experienced shortness of breath and a burning sensation after recovering from the flu in late January. The discomfort was atypical and has since resolved. She attributes these symptoms to a residual effect of the flu, for which she took a single dose of a newer antiviral medication, not Tamiflu.  She has a history of obstructive hypertrophic cardiomyopathy and recently underwent an echocardiogram last week, which showed no gradient. Her heart murmur, previously absent, has returned. She has a resting heart rate of 59 to 61 bpm and has experienced paroxysmal supraventricular tachycardia in the past, but no recent episodes of concerning arrhythmias.  Following her recovery from the flu, she attempted to resume physical activity by walking on her treadmill, which she felt set her back, as it was challenging to return to her previous level of activity. She has since been able to walk but has not resumed running, which she misses. No current chest pain or discomfort during walking or vigorous activity.  Her family history includes her father having an LVAD due to ischemic cardiomyopathy. She has two daughters, one of whom has been screened for hypertrophic cardiomyopathy, while the other has not yet been screened.   Relevant  histories: .  Social  2023: Sober Family member had an LVAD related to ischemic cardiomyopathy M GF Had multiple heart attacks at 80 She has 2 brothers to 2 daughters. Maternal Aunt died suddenly at the age 42.  She has a 27 year old daughter who needs close interval imaging, a 59 year old daugther who need 5 year screening, two brothers who need screening; she declined genetic testing in 2024 ROS: As per HPI.   Studies Reviewed: .   Cardiac Studies & Procedures   ______________________________________________________________________________________________   STRESS TESTS  EXERCISE TOLERANCE TEST (ETT) 11/16/2022  Narrative   No ST deviation was noted.   Negative, adequate stress test.   Hypertensive blood pressure response to exercise.   ECHOCARDIOGRAM  ECHOCARDIOGRAM COMPLETE 09/05/2023  Narrative ECHOCARDIOGRAM REPORT    Patient Name:   Laura Marshall Date of Exam: 09/05/2023 Medical Rec #:  [id number]         Height:       63.0 in Accession #:    [id number]        Weight:       146.0 lb Date of Birth:  1965/08/16         BSA:          1.692 m Patient Age:    58 years          BP:           122/78 mmHg Patient Gender: F                 HR:  58 bpm. Exam Location:  [address]  Procedure: 2D Echo, 3D Echo, Cardiac Doppler and Color Doppler (Both Spectral and Color Flow Doppler were utilized during procedure).  Indications:    I42.2 HOCM  History:        Patient has prior history of Echocardiogram examinations, most recent 10/21/2022. Cardiomyopathy, Signs/Symptoms:Murmur and Shortness of Breath; Risk Factors:Diabetes, Dyslipidemia and Family History of Coronary Artery Disease. HOCM (from previous echo), Hashimotos Thyroiditis, Recent episodes of Palpitations and SOB following Influenza A, Bilateral Breast Augmentation.  Sonographer:    Farrel Conners RDCS Referring Phys: Riley Lam, A  IMPRESSIONS   1. There is no systolic anterior  motion of the mitral valve and there is no outflow obstruction at rest or following the Valsalva maneuver. Left ventricular ejection fraction, by estimation, is 65 to 70%. Left ventricular ejection fraction by 3D volume is 79 %. The left ventricle has normal function. The left ventricle has no regional wall motion abnormalities. Left ventricular diastolic parameters are consistent with Grade II diastolic dysfunction (pseudonormalization). Elevated left atrial pressure. 2. Right ventricular systolic function is normal. The right ventricular size is normal. 3. Left atrial size was mildly dilated. 4. The mitral valve is normal in structure. Trivial mitral valve regurgitation. No evidence of mitral stenosis. 5. The aortic valve is tricuspid. There is mild calcification of the aortic valve. Aortic valve regurgitation is not visualized. Aortic valve sclerosis/calcification is present, without any evidence of aortic stenosis. 6. There is mild dilatation of the ascending aorta, measuring 41 mm.  Comparison(s): Prior images reviewed side by side. There is complete resolution of systolic anterior motion of the mitral valve, mitral insufficiency and outflow tract obstruction.  FINDINGS Left Ventricle: There is no systolic anterior motion of the mitral valve and there is no outflow obstruction at rest or following the Valsalva maneuver. Left ventricular ejection fraction, by estimation, is 65 to 70%. Left ventricular ejection fraction by 3D volume is 79 %. The left ventricle has normal function. The left ventricle has no regional wall motion abnormalities. The left ventricular internal cavity size was normal in size. There is borderline asymmetric left ventricular hypertrophy of the basal-septal segment. Left ventricular diastolic parameters are consistent with Grade II diastolic dysfunction (pseudonormalization). Elevated left atrial pressure.  Right Ventricle: The right ventricular size is normal. No increase  in right ventricular wall thickness. Right ventricular systolic function is normal.  Left Atrium: Left atrial size was mildly dilated.  Right Atrium: Right atrial size was normal in size.  Pericardium: There is no evidence of pericardial effusion.  Mitral Valve: The mitral valve is normal in structure. Trivial mitral valve regurgitation. No evidence of mitral valve stenosis.  Tricuspid Valve: The tricuspid valve is normal in structure. Tricuspid valve regurgitation is not demonstrated.  Aortic Valve: The aortic valve is tricuspid. There is mild calcification of the aortic valve. Aortic valve regurgitation is not visualized. Aortic valve sclerosis/calcification is present, without any evidence of aortic stenosis.  Pulmonic Valve: The pulmonic valve was grossly normal. Pulmonic valve regurgitation is not visualized. No evidence of pulmonic stenosis.  Aorta: The aortic root is normal in size and structure. There is mild dilatation of the ascending aorta, measuring 41 mm.  IAS/Shunts: No atrial level shunt detected by color flow Doppler.   LEFT VENTRICLE PLAX 2D LVIDd:         3.00 cm         Diastology LVIDs:         1.80 cm  LV e' medial:    5.55 cm/s LV PW:         1.10 cm         LV E/e' medial:  16.6 LV IVS:        1.30 cm         LV e' lateral:   10.00 cm/s LVOT diam:     2.10 cm         LV E/e' lateral: 9.2 LV SV:         103 LV SV Index:   61 LVOT Area:     3.46 cm        3D Volume EF LV 3D EF:    Left ventricul ar ejection fraction by 3D volume is 79 %.  3D Volume EF: 3D EF:        79 % LV EDV:       90 ml LV ESV:       19 ml LV SV:        71 ml  RIGHT VENTRICLE RV Basal diam:  3.80 cm RV S prime:     15.50 cm/s TAPSE (M-mode): 2.4 cm RVSP:           20.1 mmHg  LEFT ATRIUM             Index        RIGHT ATRIUM           Index LA diam:        2.60 cm 1.54 cm/m   RA Pressure: 3.00 mmHg LA Vol (A2C):   47.7 ml 28.20 ml/m  RA Area:     13.60 cm LA  Vol (A4C):   50.8 ml 30.03 ml/m  RA Volume:   36.60 ml  21.64 ml/m LA Biplane Vol: 49.0 ml 28.97 ml/m AORTIC VALVE LVOT Vmax:   145.00 cm/s LVOT Vmean:  92.150 cm/s LVOT VTI:    0.297 m  AORTA Ao Root diam: 2.90 cm Ao Asc diam:  4.10 cm  MITRAL VALVE               TRICUSPID VALVE MV Area (PHT): cm         TR Peak grad:   17.1 mmHg MV Decel Time: 191 msec    TR Vmax:        207.00 cm/s MV E velocity: 92.00 cm/s  Estimated RAP:  3.00 mmHg MV A velocity: 81.25 cm/s  RVSP:           20.1 mmHg MV E/A ratio:  1.13 SHUNTS Systemic VTI:  0.30 m Systemic Diam: 2.10 cm  Mihai Croitoru MD Electronically signed by Thurmon Fair MD Signature Date/Time: 09/05/2023/11:15:17 AM    Final    MONITORS  LONG TERM MONITOR (3-14 DAYS) 12/05/2022  Narrative   Patient had a minimum heart rate of 42 bpm, maximum heart rate of 176 bpm, and average heart rate of 66 bpm.   Predominant underlying rhythm was sinus rhythm.   Paroxysmal SVT  lasting 9 beats at longest with a max rate of 176 bpm at fastest.   Isolated PACs were rare (<1.0%).   Isolated PVCs were rare (<1.0%).   No triggered and diary events.  No AF or NSVT in the setting of HCM.       ______________________________________________________________________________________________       Physical Exam:    VS:  BP 118/76   Pulse 61   Ht 5' 2.75" (1.594 m)   Wt 66 kg  SpO2 98%   BMI 26.00 kg/m    Wt Readings from Last 3 Encounters:  09/11/23 66 kg  11/15/22 66.2 kg  07/18/16 62.1 kg    Gen: no distress  Neck: No JVD Cardiac: No Rubs or Gallops, harsh systolic murmur worse with Valsalva and standing, regular bradycardia, + radial pulses Respiratory: Clear to auscultation bilaterally, normal effort, normal  respiratory rate GI: Soft, nontender, non-distended  MS: No  edema;  moves all extremities Integument: Skin feels warm Neuro:  At time of evaluation, alert and oriented to person/place/time/situation  Psych:  Normal affect, patient feels well   ASSESSMENT AND PLAN: .    Obstructive Hypertrophic Cardiomyopathy She experienced shortness of breath and a burning sensation, likely a flare-up of obstructive hypertrophic cardiomyopathy, possibly exacerbated by a recent flu infection. These symptoms have resolved. The echocardiogram showed no gradient, and the heart murmur is dynamic, becoming more pronounced with certain activities. She is currently asymptomatic but has not resumed running, which she misses. - NYHA I, resting gradient has resolved presently - Encourage gradual return to run-walk activities - If symptoms recur, perform a stress echocardiogram to assess for inducible gradient - Discuss potential treatment options if inducible gradient is found (has resting bradycardia and would need SRT or CMI; would stop hydrochlorothiazide dose prior to stress echo) - Consider coronary CTA if stress test is normal but symptoms persist   Hyperlipidemia with DM Hyperlipidemia is a risk factor for cardiovascular disease. A coronary artery calcium score is offered to assess for plaque buildup, which will guide the aggressiveness of cholesterol management. - Offer coronary artery calcium score to assess for plaque buildup - continue statin  Mild aortic dilation - Echo stable (2025) repeat in 2026.  Family History of Cardiovascular Disease Family history of ischemic cardiomyopathy and LVAD placement in her father increases her risk for cardiovascular disease. Screening for hypertrophic cardiomyopathy in her daughters is recommended. The youngest daughter has been screened, but the older daughter has not yet been screened. - Ensure both daughters are screened for hypertrophic cardiomyopathy with echocardiogram, reviewed options for screening      Riley Lam, MD FASE [facility] Cardiologist [facility]  [address] [place], [address] [place], [place] [address] [phone]  10:18 AM

## 2023-09-11 NOTE — Patient Instructions (Signed)
 Medication Instructions:  Your physician recommends that you continue on your current medications as directed. Please refer to the Current Medication list given to you today.  *If you need a refill on your cardiac medications before your next appointment, please call your pharmacy*   Lab Work: NONE  If you have labs (blood work) drawn today and your tests are completely normal, you will receive your results only by: MyChart Message (if you have MyChart) OR A paper copy in the mail If you have any lab test that is abnormal or we need to change your treatment, we will call you to review the results.   Testing/Procedures: Your physician has ordered you to have non-invasive CT for Calcium Scoring of your heart. It will calculate your risk of developing Coronary Artery Disease (CAD) by measuring the amount of buildup of calcium in the plaque in the coronary arteries (arteries surrounding your heart).  This test will be $99 out of pocket.    Follow-Up: At [facility], you and your health needs are our priority.  As part of our continuing mission to provide you with exceptional heart care, we have created designated Provider Care Teams.  These Care Teams include your primary Cardiologist (physician) and Advanced Practice Providers (APPs -  Physician Assistants and Nurse Practitioners) who all work together to provide you with the care you need, when you need it.   Your next appointment:   8 month(s)  Provider:   Riley Lam

## 2023-09-13 ENCOUNTER — Ambulatory Visit (HOSPITAL_COMMUNITY)
Admission: RE | Admit: 2023-09-13 | Discharge: 2023-09-13 | Disposition: A | Discharge status: Home / Self Care | Payer: Self-pay | Source: Ambulatory Visit | Attending: Internal Medicine | Admitting: Internal Medicine

## 2023-09-13 DIAGNOSIS — E782 Mixed hyperlipidemia: Secondary | ICD-10-CM | POA: Insufficient documentation

## 2023-09-13 DIAGNOSIS — I422 Other hypertrophic cardiomyopathy: Secondary | ICD-10-CM | POA: Insufficient documentation

## 2023-09-14 ENCOUNTER — Encounter: Payer: Self-pay | Admitting: Internal Medicine

## 2023-11-07 DIAGNOSIS — Z1231 Encounter for screening mammogram for malignant neoplasm of breast: Secondary | ICD-10-CM | POA: Diagnosis not present

## 2023-11-10 DIAGNOSIS — N6321 Unspecified lump in the left breast, upper outer quadrant: Secondary | ICD-10-CM | POA: Diagnosis not present

## 2023-11-17 DIAGNOSIS — N6320 Unspecified lump in the left breast, unspecified quadrant: Secondary | ICD-10-CM | POA: Diagnosis not present

## 2023-11-17 DIAGNOSIS — C50412 Malignant neoplasm of upper-outer quadrant of left female breast: Secondary | ICD-10-CM | POA: Diagnosis not present

## 2023-11-17 DIAGNOSIS — R92322 Mammographic fibroglandular density, left breast: Secondary | ICD-10-CM | POA: Diagnosis not present

## 2023-11-17 DIAGNOSIS — N6321 Unspecified lump in the left breast, upper outer quadrant: Secondary | ICD-10-CM | POA: Diagnosis not present

## 2023-11-17 DIAGNOSIS — Z1721 Progesterone receptor positive status: Secondary | ICD-10-CM | POA: Diagnosis not present

## 2023-11-17 DIAGNOSIS — Z170 Estrogen receptor positive status [ER+]: Secondary | ICD-10-CM | POA: Diagnosis not present

## 2023-11-29 ENCOUNTER — Encounter: Payer: Self-pay | Admitting: *Deleted

## 2023-11-29 DIAGNOSIS — Z170 Estrogen receptor positive status [ER+]: Secondary | ICD-10-CM | POA: Insufficient documentation

## 2023-12-06 DIAGNOSIS — Z170 Estrogen receptor positive status [ER+]: Secondary | ICD-10-CM | POA: Diagnosis not present

## 2023-12-06 DIAGNOSIS — C50412 Malignant neoplasm of upper-outer quadrant of left female breast: Secondary | ICD-10-CM | POA: Diagnosis not present

## 2023-12-07 ENCOUNTER — Inpatient Hospital Stay
Admission: RE | Admit: 2023-12-07 | Discharge: 2023-12-07 | Disposition: A | Discharge status: Home / Self Care | Payer: Self-pay | Source: Ambulatory Visit | Attending: Radiation Oncology | Admitting: Radiation Oncology

## 2023-12-07 ENCOUNTER — Other Ambulatory Visit: Payer: Self-pay | Admitting: Radiation Oncology

## 2023-12-07 DIAGNOSIS — Z170 Estrogen receptor positive status [ER+]: Secondary | ICD-10-CM

## 2023-12-07 DIAGNOSIS — C50412 Malignant neoplasm of upper-outer quadrant of left female breast: Secondary | ICD-10-CM

## 2023-12-07 DIAGNOSIS — D493 Neoplasm of unspecified behavior of breast: Secondary | ICD-10-CM | POA: Diagnosis not present

## 2023-12-08 ENCOUNTER — Other Ambulatory Visit: Payer: Self-pay | Admitting: General Surgery

## 2023-12-11 NOTE — Progress Notes (Signed)
 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 {:18581} Pacemaker/ICD? {:18581} Possible current pregnancy?{:18581} Is the patient on methotrexate? {:18581}  Current Complaints / other details:  ***

## 2023-12-12 ENCOUNTER — Inpatient Hospital Stay: Attending: Hematology and Oncology | Admitting: Hematology and Oncology

## 2023-12-12 ENCOUNTER — Ambulatory Visit
Admission: RE | Admit: 2023-12-12 | Discharge: 2023-12-12 | Discharge status: Home / Self Care | Source: Ambulatory Visit | Attending: Radiation Oncology | Admitting: Radiation Oncology

## 2023-12-12 ENCOUNTER — Ambulatory Visit
Admission: RE | Admit: 2023-12-12 | Discharge: 2023-12-12 | Disposition: A | Discharge status: Home / Self Care | Source: Ambulatory Visit | Attending: Radiation Oncology | Admitting: Radiation Oncology

## 2023-12-12 ENCOUNTER — Encounter: Payer: Self-pay | Admitting: Radiation Oncology

## 2023-12-12 VITALS — BP 141/77 | HR 60 | Temp 97.5°F | Resp 18 | Ht 62.75 in | Wt 143.4 lb

## 2023-12-12 DIAGNOSIS — Z1721 Progesterone receptor positive status: Secondary | ICD-10-CM | POA: Diagnosis not present

## 2023-12-12 DIAGNOSIS — Z79899 Other long term (current) drug therapy: Secondary | ICD-10-CM | POA: Diagnosis not present

## 2023-12-12 DIAGNOSIS — Z79890 Hormone replacement therapy (postmenopausal): Secondary | ICD-10-CM | POA: Insufficient documentation

## 2023-12-12 DIAGNOSIS — Z800 Family history of malignant neoplasm of digestive organs: Secondary | ICD-10-CM | POA: Insufficient documentation

## 2023-12-12 DIAGNOSIS — C50412 Malignant neoplasm of upper-outer quadrant of left female breast: Secondary | ICD-10-CM | POA: Insufficient documentation

## 2023-12-12 DIAGNOSIS — Z8041 Family history of malignant neoplasm of ovary: Secondary | ICD-10-CM | POA: Insufficient documentation

## 2023-12-12 DIAGNOSIS — Z803 Family history of malignant neoplasm of breast: Secondary | ICD-10-CM | POA: Diagnosis not present

## 2023-12-12 DIAGNOSIS — Z1732 Human epidermal growth factor receptor 2 negative status: Secondary | ICD-10-CM | POA: Insufficient documentation

## 2023-12-12 DIAGNOSIS — Z170 Estrogen receptor positive status [ER+]: Secondary | ICD-10-CM | POA: Insufficient documentation

## 2023-12-12 DIAGNOSIS — R011 Cardiac murmur, unspecified: Secondary | ICD-10-CM | POA: Insufficient documentation

## 2023-12-12 DIAGNOSIS — Z833 Family history of diabetes mellitus: Secondary | ICD-10-CM | POA: Insufficient documentation

## 2023-12-12 NOTE — Addendum Note (Signed)
 Encounter addended by: Izell Domino, MD on: 12/12/2023 2:29 PM  Actions taken: Clinical Note Signed

## 2023-12-12 NOTE — Progress Notes (Signed)
 [facility] CONSULT NOTE  Patient Care Team: Waddell Rake, MD as PCP - General (Family Medicine) Tyree Nanetta SAILOR, RN as Oncology Nurse Navigator Odean Potts, MD as Consulting Physician (Hematology and Oncology) Curvin Deward MOULD, MD as Consulting Physician (General Surgery)  CHIEF COMPLAINTS/PURPOSE OF CONSULTATION:  Newly diagnosed breast cancer  HISTORY OF PRESENTING ILLNESS:   History of Present Illness Laura Marshall is a 58 year old female who presents for consultation following a diagnosis of invasive ductal carcinoma of the breast.  A routine mammogram identified an abnormality, leading to further evaluation with ultrasound and biopsy. The biopsy confirmed invasive ductal carcinoma, measuring 1.6 cm. The cancer is estrogen and progesterone receptor positive, and HER2 negative. The tumor is classified as grade 1 and stage 1A. No enlarged lymph nodes were noted on ultrasound.  Her family history includes an aunt with ovarian cancer who carried the BRCA gene, although she tested negative for the BRCA gene several years ago. Her father's mother had breast cancer, and her mother passed away from leiomyosarcoma. There is consideration for updated genetic testing due to advancements in testing panels.     I reviewed her records extensively and collaborated the history with the patient.  SUMMARY OF ONCOLOGIC HISTORY: Oncology History  Malignant neoplasm of upper-outer quadrant of left breast in female, estrogen receptor positive (HCC)  11/10/2022 Initial Diagnosis   Screening mammogram detected left breast mass 1.6 cm biopsy: Grade 1 IDC ER 99%/PR 50% HER2: 2+ by IHC FISH: Negative (biopsy done at [facility])   12/12/2023 Cancer Staging   Staging form: Breast, AJCC 8th Edition - Clinical stage from 12/12/2023: Stage IA (cT1c, cN0, cM0, G1, ER+, PR+, HER2-) - Signed by Izell Domino, MD on 12/12/2023 Stage prefix: Initial diagnosis Histologic grading  system: 3 grade system      MEDICAL HISTORY:  Past Medical History:  Diagnosis Date   Anxiety    Depression    Family history of breast cancer    Family history of colon cancer    Family history of coronary arteriosclerosis    Family history of ovarian cancer    Family history of prostate cancer    Family history of stomach cancer    Hashimoto's thyroiditis    Heart murmur    Herpes simplex    Hyperlipidemia    Palpitations    Papanicolaou smear of cervix with atypical squamous cells of undetermined significance (ASC-US )    Thyroid  nodule    Type 2 diabetes mellitus (HCC)    Vaginal lesion    Vulvitis     SURGICAL HISTORY: Past Surgical History:  Procedure Laterality Date   AUGMENTATION MAMMAPLASTY Bilateral     SOCIAL HISTORY: Social History   Socioeconomic History   Marital status: Married    Spouse name: Not on file   Number of children: Not on file   Years of education: Not on file   Highest education level: Not on file  Occupational History   Not on file  Tobacco Use   Smoking status: Never   Smokeless tobacco: Never  Substance and Sexual Activity   Alcohol use: Yes    Alcohol/week: 2.0 - 3.0 standard drinks of alcohol    Types: 2 - 3 Glasses of wine per week   Drug use: No   Sexual activity: Not on file  Other Topics Concern   Not on file  Social History Narrative   Not on file   Social Drivers of Health   Financial  Resource Strain: Not on file  Food Insecurity: No Food Insecurity (12/12/2023)   Hunger Vital Sign    Worried About Running Out of Food in the Last Year: Never true    Ran Out of Food in the Last Year: Never true  Transportation Needs: No Transportation Needs (12/12/2023)   PRAPARE - Administrator, Civil Service (Medical): No    Lack of Transportation (Non-Medical): No  Physical Activity: Not on file  Stress: Not on file  Social Connections: Not on file  Intimate Partner Violence: Not At Risk (12/12/2023)    Humiliation, Afraid, Rape, and Kick questionnaire    Fear of Current or Ex-Partner: No    Emotionally Abused: No    Physically Abused: No    Sexually Abused: No    FAMILY HISTORY: Family History  Problem Relation Age of Onset   CAD Other    Cancer Mother 38       leiomyosarcoma   Prostate cancer Father 96   Diabetes Father    CAD Father        CABG @60 , LV heart pump   Congestive Heart Failure Father    Kidney Stones Brother    Diabetes Brother    Congenital heart disease Maternal Aunt        died in surgery at 46   Ovarian cancer Maternal Aunt        BRCA+   Liver cancer Paternal Aunt        probably not primary cancer   Stomach cancer Maternal Grandmother 30   CAD Maternal Grandfather    Valvular heart disease Maternal Grandfather    Congestive Heart Failure Maternal Grandfather    Breast cancer Paternal Grandmother        dx in her 95s, died at 16   Meniere's disease Brother    Ovarian cancer Maternal Aunt 46   Cancer Paternal Aunt        NOS   Diabetes Paternal Aunt    Breast cancer Other 53       MGMs sister   Colon cancer Other        MGMs brother   Breast cancer Other        MGMs mother   Breast cancer Other 15       MGMs maternal aunt   Breast cancer Cousin        3 of patient's mother's first cousins   Other Daughter        BRCA+   Diabetes Daughter    Diabetes type I Daughter    Colon cancer Cousin     ALLERGIES:  is allergic to fire ant, latex, minocin [minocycline hcl], monosodium glutamate, and sulfamethoxazole-trimethoprim.  MEDICATIONS:  Current Outpatient Medications  Medication Sig Dispense Refill   ARMOUR THYROID  15 MG tablet daily.     hydrochlorothiazide (HYDRODIURIL) 12.5 MG tablet Take 12.5 mg by mouth daily.     ibuprofen (ADVIL) 800 MG tablet as needed for headache or moderate pain.     levothyroxine (SYNTHROID, LEVOTHROID) 25 MCG tablet Take 25 mcg by mouth daily before breakfast.     Prenatal Multivit-Min-Fe-FA (PRENATAL  VITAMINS PO) Take 1 tablet by mouth daily.     Pseudoephedrine HCl (SUDAFED PO) Take by mouth as needed.     rosuvastatin (CRESTOR) 5 MG tablet Take 5 mg by mouth daily.     tretinoin (RETIN-A) 0.025 % cream 3 (three) times a week.     No current facility-administered medications for this visit.  REVIEW OF SYSTEMS:   Constitutional: Denies fevers, chills or abnormal night sweats Breast:  Denies any palpable lumps or discharge All other systems were reviewed with the patient and are negative.  PHYSICAL EXAMINATION: ECOG PERFORMANCE STATUS: 0 - Asymptomatic  There were no vitals filed for this visit. There were no vitals filed for this visit.  GENERAL:alert, no distress and comfortable   LABORATORY DATA:  I have reviewed the data as listed Lab Results  Component Value Date   WBC 5.2 11/15/2022   HGB 14.2 11/15/2022   HCT 41.0 11/15/2022   MCV 90 11/15/2022   PLT 217 11/15/2022   No results found for: NA, K, CL, CO2  RADIOGRAPHIC STUDIES: I have personally reviewed the radiological reports and agreed with the findings in the report.  ASSESSMENT AND PLAN:  Malignant neoplasm of upper-outer quadrant of left breast in female, estrogen receptor positive (HCC) 1999: Breast augmentation 11/10/2022: Screening mammogram detected left breast mass 1.6 cm biopsy: Grade 1 IDC ER 99%/PR 50% HER2: 2+ by IHC FISH: Negative (biopsy done at [facility])  Pathology and radiology counseling:Discussed with the patient, the details of pathology including the type of breast cancer,the clinical staging, the significance of ER, PR and HER-2/neu receptors and the implications for treatment. After reviewing the pathology in detail, we proceeded to discuss the different treatment options between surgery, radiation, chemotherapy, antiestrogen therapies.  Recommendations: 1. Breast conserving surgery agree with Dr. Ebbie that the lymph node biopsy is not necessary given the  favorable nature for breast cancer) 2. Oncotype DX testing to determine if chemotherapy would be of any benefit followed by 3. Adjuvant radiation therapy followed by 4. Adjuvant antiestrogen therapy  Oncotype counseling: I discussed Oncotype DX test. I explained to the patient that this is a 21 gene panel to evaluate patient tumors DNA to calculate recurrence score. This would help determine whether patient has high risk or low risk breast cancer. She understands that if her tumor was found to be high risk, she would benefit from systemic chemotherapy. If low risk, no need of chemotherapy.  Return to clinic after surgery to discuss final pathology report and then determine if Oncotype DX testing will need to be sent.  Assessment and Plan Assessment & Plan Invasive ductal carcinoma of breast Stage 1A, 1.6 cm, ER/PR positive, HER2 negative, low grade. Low risk due to tumor size, receptor status, no lymph node involvement. BRCA negative. Oncotype DX to determine chemotherapy need. Lumpectomy planned, radiation therapy recommended. Anastrozole for ER/PR positivity. Chemotherapy unlikely unless high risk indicated. Avoided unnecessary lymph node surgery. Radiation to lymph nodes decision post-surgery. - Schedule genetics appointment for updated testing. - Perform lumpectomy with Dr. Ebbie. - Order Oncotype DX test. - Discuss radiation therapy post-surgery with Dr. Izell. - Prescribe anastrozole 1 mg daily for 5-6 years post-radiation. - Schedule follow-up 7-10 days post-surgery for pathology results.     All questions were answered. The patient knows to call the clinic with any problems, questions or concerns.    Laura K Kalicia Dufresne, MD 12/12/23

## 2023-12-12 NOTE — Assessment & Plan Note (Signed)
 1999: Breast augmentation 11/10/2022: Screening mammogram detected left breast mass 1.6 cm biopsy: Grade 1 IDC ER 99%/PR 50% HER2: 2+ by IHC FISH: Negative (biopsy done at [facility])  Pathology and radiology counseling:Discussed with the patient, the details of pathology including the type of breast cancer,the clinical staging, the significance of ER, PR and HER-2/neu receptors and the implications for treatment. After reviewing the pathology in detail, we proceeded to discuss the different treatment options between surgery, radiation, chemotherapy, antiestrogen therapies.  Recommendations: 1. Breast conserving surgery followed by 2. Oncotype DX testing to determine if chemotherapy would be of any benefit followed by 3. Adjuvant radiation therapy followed by 4. Adjuvant antiestrogen therapy  Oncotype counseling: I discussed Oncotype DX test. I explained to the patient that this is a 21 gene panel to evaluate patient tumors DNA to calculate recurrence score. This would help determine whether patient has high risk or low risk breast cancer. She understands that if her tumor was found to be high risk, she would benefit from systemic chemotherapy. If low risk, no need of chemotherapy.  Return to clinic after surgery to discuss final pathology report and then determine if Oncotype DX testing will need to be sent.

## 2023-12-12 NOTE — Progress Notes (Addendum)
 Radiation Oncology         [phone] ________________________________  Initial outpatient Consultation  Name: Laura Marshall MRN: [id number]  Date: 12/12/2023  DOB: March 21, 1966  CC:Waddell Rake, MD  Odean Potts, MD   REFERRING PHYSICIAN: Odean Potts, MD  DIAGNOSIS:    ICD-10-CM   1. Malignant neoplasm of upper-outer quadrant of left breast in female, estrogen receptor positive (HCC)  C50.412    Z17.0        Cancer Staging  Malignant neoplasm of upper-outer quadrant of left breast in female, estrogen receptor positive (HCC) Staging form: Breast, AJCC 8th Edition - Clinical stage from 12/12/2023: Stage IA (cT1c, cN0, cM0, G1, ER+, PR+, HER2-) - Signed by Izell Domino, MD on 12/12/2023 Stage prefix: Initial diagnosis Histologic grading system: 3 grade system   Malignant neoplasm of upper-outer quadrant of left breast in female, estrogen receptor positive (HCC)   CHIEF COMPLAINT: Here to discuss management of left breast cancer  HISTORY OF PRESENT ILLNESS::Laura Marshall is a 58 y.o. female who presented with breast abnormality on the following imaging: bilateral screening mammogram on the date of 11/17/23.  No symptoms, if any, at that time, were exhibited. Ultrasound of breast revealed a 1.6 cm mass in lateral left breast without any lymphadenopathy indicated.    Patient has undergone a genetic testing several years back due to family history of breast and ovarian cancer. Genetic testing did not indicate the presence of any pathogenic mutations. She undergone a prior breast augmentation procedure in 1999.  In light of findings, she underwent a needle core biopsy on date of 11/17/23 showed grade 1 invasive ductal carcinoma with tumor size of 1.2 cm.  ER status: 99 %  positive with average Intensity of Staining; PR status 50 %  positive with average Intensity of Staining, Her2 status negative; Grade 1.  Patient was then referred to Dr. Ebbie on 12/06/23 to discuss  further treatment plan. Upon discussion patient opted to proceed with a left breast lumpectomy followed by radiation therapy and antiestrogen therapy.    PREVIOUS RADIATION THERAPY: No  PAST MEDICAL HISTORY:  has a past medical history of Anxiety, Depression, Family history of breast cancer, Family history of colon cancer, Family history of coronary arteriosclerosis, Family history of ovarian cancer, Family history of prostate cancer, Family history of stomach cancer, Hashimoto's thyroiditis, Heart murmur, Herpes simplex, Hyperlipidemia, Palpitations, Papanicolaou smear of cervix with atypical squamous cells of undetermined significance (ASC-US ), Thyroid  nodule, Type 2 diabetes mellitus (HCC), Vaginal lesion, and Vulvitis.    PAST SURGICAL HISTORY: Past Surgical History:  Procedure Laterality Date   AUGMENTATION MAMMAPLASTY Bilateral     FAMILY HISTORY: family history includes Breast cancer in her cousin, paternal grandmother, and another family member; Breast cancer (age of onset: 21) in an other family member; Breast cancer (age of onset: 52) in an other family member; CAD in her father, maternal grandfather, and another family member; Cancer in her paternal aunt; Cancer (age of onset: 36) in her mother; Colon cancer in her cousin and another family member; Congenital heart disease in her maternal aunt; Congestive Heart Failure in her father and maternal grandfather; Diabetes in her brother, daughter, father, and paternal aunt; Diabetes type I in her daughter; Kidney Stones in her brother; Liver cancer in her paternal aunt; Meniere's disease in her brother; Other in her daughter; Ovarian cancer in her maternal aunt; Ovarian cancer (age of onset: 3) in her maternal aunt; Prostate cancer (age of onset: 42) in her father; Stomach cancer (  age of onset: 66) in her maternal grandmother; Valvular heart disease in her maternal grandfather.  SOCIAL HISTORY:  reports that she has never smoked. She has never  used smokeless tobacco. She reports current alcohol use of about 2.0 - 3.0 standard drinks of alcohol per week. She reports that she does not use drugs.  ALLERGIES: Animator, Latex, Minocin [minocycline hcl], Monosodium glutamate, and Sulfamethoxazole-trimethoprim  MEDICATIONS:  Current Outpatient Medications  Medication Sig Dispense Refill   ARMOUR THYROID  15 MG tablet daily.     hydrochlorothiazide (HYDRODIURIL) 12.5 MG tablet Take 12.5 mg by mouth daily.     ibuprofen (ADVIL) 800 MG tablet as needed for headache or moderate pain.     levothyroxine (SYNTHROID, LEVOTHROID) 25 MCG tablet Take 25 mcg by mouth daily before breakfast.     Prenatal Multivit-Min-Fe-FA (PRENATAL VITAMINS PO) Take 1 tablet by mouth daily.     Pseudoephedrine HCl (SUDAFED PO) Take by mouth as needed.     rosuvastatin (CRESTOR) 5 MG tablet Take 5 mg by mouth daily.     tretinoin (RETIN-A) 0.025 % cream 3 (three) times a week.     No current facility-administered medications for this encounter.    REVIEW OF SYSTEMS: As above in HPI.   PHYSICAL EXAM:  height is 5' 2.75 (1.594 m) and weight is 143 lb 6 oz (65 kg). Her temporal temperature is 97.5 F (36.4 C) (abnormal). Her blood pressure is 141/77 (abnormal) and her pulse is 60. Her respiration is 18 and oxygen saturation is 100%.   General: Alert and oriented, in no acute distress HEENT: Head is normocephalic. Extraocular movements are intact.  Heart: Regular in rate and rhythm with audible murmur Chest: Clear to auscultation bilaterally, with no rhonchi, wheezes, or rales. Abdomen: Soft, nontender, nondistended, with no rigidity or guarding. Extremities: No cyanosis or edema. Skin: No concerning lesions. Musculoskeletal: symmetric strength and muscle tone throughout. Neurologic: Cranial nerves II through XII are grossly intact. No obvious focalities. Speech is fluent. Coordination is intact. Psychiatric: Judgment and insight are intact. Affect is  appropriate. Breasts: 1.5cm mass, 3:00, left breast . No other palpable masses appreciated in the breasts or axillae .    ECOG = 0  0 - Asymptomatic (Fully active, able to carry on all predisease activities without restriction)  1 - Symptomatic but completely ambulatory (Restricted in physically strenuous activity but ambulatory and able to carry out work of a light or sedentary nature. For example, light housework, office work)  2 - Symptomatic, <50% in bed during the day (Ambulatory and capable of all self care but unable to carry out any work activities. Up and about more than 50% of waking hours)  3 - Symptomatic, >50% in bed, but not bedbound (Capable of only limited self-care, confined to bed or chair 50% or more of waking hours)  4 - Bedbound (Completely disabled. Cannot carry on any self-care. Totally confined to bed or chair)  5 - Death   Raylene MM, Creech RH, Tormey DC, et al. [phone]). Toxicity and response criteria of the [facility]. Am. DOROTHA Bridges. Oncol. 5 (6): 649-55   LABORATORY DATA:   CBC    Component Value Date/Time   WBC 5.2 11/15/2022 1515   RBC 4.54 11/15/2022 1515   HGB 14.2 11/15/2022 1515   HCT 41.0 11/15/2022 1515   PLT 217 11/15/2022 1515   MCV 90 11/15/2022 1515   MCH 31.3 11/15/2022 1515   MCHC 34.6 11/15/2022 1515   RDW 12.1  11/15/2022 1515    CMP  No results found for: NA, K, CL, CO2, GLUCOSE, BUN, CREATININE, CALCIUM, PROT, ALBUMIN, AST, ALT, ALKPHOS, BILITOT, GFR, EGFR, GFRNONAA    RADIOGRAPHY:  as above    IMPRESSION/PLAN:  Cancer Staging  Malignant neoplasm of upper-outer quadrant of left breast in female, estrogen receptor positive (HCC) Staging form: Breast, AJCC 8th Edition - Clinical stage from 12/12/2023: Stage IA (cT1c, cN0, cM0, G1, ER+, PR+, HER2-) - Signed by Izell Domino, MD on 12/12/2023 Stage prefix: Initial diagnosis Histologic grading system: 3 grade system   It was  a pleasure meeting the patient today. We discussed the risks, benefits, and side effects of radiotherapy. I recommend radiotherapy to the left breast with standard hypofractionation technique to reduce her risk of locoregional recurrence by 2/3.  We discussed the option using high tangents to treat the adjacent axillary nodes which is optional given that she is not going to undergo a sentinel lymph node biopsy.  She would like to think about this option until her follow-up appointment.  However she is very enthusiastic about radiation therapy to her left breast.  She understands that the number of treatments would not increase if we use high tangents; we discussed that radiation would take approximately 4 weeks to complete and that I would give the patient a few weeks to heal following surgery before starting treatment planning.    She has already met with plastic surgery and will follow-up with plastic surgery after she completes radiation with the plan of possibly undergoing a revision of her implants if her long-term cosmesis and comfort are not satisfactory.  We discussed the risk of capsular contracture and scar tissue in the setting of breast implants but fortunately this risk is lower for patients that have had breast implants for decades prior to radiation therapy.    If chemotherapy were to be given, this would precede radiotherapy. We spoke about acute effects including skin irritation and fatigue as well as much less common late effects including internal organ injury or irritation. We spoke about the latest technology that is used to minimize the risk of late effects for patients undergoing radiotherapy to the breast or chest wall. No guarantees of treatment were given. I reviewed the logistics, benefits, risks, and potential side effects of this treatment in detail. Risks may include but not necessary be limited to acute and late injury tissue in the radiation fields such as skin irritation (change  in color/pigmentation, itching, dryness, pain, peeling). She may experience fatigue. We also discussed possible risk of long term cosmetic changes or scar tissue. There is also a smaller risk for lung toxicity, cardiac toxicity, brachial plexopathy, lymphedema, musculoskeletal changes, rib fragility or induction of a second malignancy, late chronic non-healing soft tissue wound.    The patient asked good questions which I answered to her satisfaction. She is enthusiastic about proceeding with treatment. A consent form has been  signed and placed in her chart. The patient is enthusiastic about proceeding with treatment. I look forward to participating in the patient's care.  I will await her referral back to me for postoperative follow-up and eventual CT simulation/treatment planning.   ADDENDUM: murmur auscultated on heart exam today. She is aware of this murmur and following it with her PCP. Has also been seen by cardiology  On date of service, in total, I spent 60 minutes on this encounter. Patient was seen in person.   __________________________________________   Domino Izell, MD  This document serves as  a record of services personally performed by Lauraine Golden, MD. It was created on her behalf by Reymundo Cartwright, a trained medical scribe. The creation of this record is based on the scribe's personal observations and the provider's statements to them. This document has been checked and approved by the attending provider.

## 2023-12-14 ENCOUNTER — Other Ambulatory Visit: Payer: Self-pay | Admitting: General Surgery

## 2023-12-14 ENCOUNTER — Telehealth: Payer: Self-pay | Admitting: *Deleted

## 2023-12-14 ENCOUNTER — Encounter (HOSPITAL_BASED_OUTPATIENT_CLINIC_OR_DEPARTMENT_OTHER): Payer: Self-pay | Admitting: General Surgery

## 2023-12-14 DIAGNOSIS — C50412 Malignant neoplasm of upper-outer quadrant of left female breast: Secondary | ICD-10-CM

## 2023-12-14 NOTE — Progress Notes (Addendum)
 Reviewed cardiac notes and history with Dr. Patrisha at [facility]. Patient will need will need cardiac clearance. Tonya at [facility] aware.

## 2023-12-14 NOTE — Telephone Encounter (Signed)
   Patient Name: Laura Marshall  DOB: [birth year] MRN: [id number]  Primary Cardiologist: Stanly DELENA Leavens, MD  Chart reviewed as part of pre-operative protocol coverage.   Patient with past medical history of hyperlipidemia, atypical chest pain, obstructive hypertrophic cardiomyopathy previously noted to have severe LVOT gradient that resolved with stopping of hydrochlorothiazide, asymptomatic paroxysmal SVT noted on cardiac monitor.   Patient was called today regarding preoperative cardiac evaluation request for breast lumpectomy with radioactive seed localization.  She reports that she is doing very well, she has resumed her regular exercise.  She is able to achieve greater than 4 METs of activity, she is currently running for 45 minutes at least 3 times a week and is tolerating very well. Today denies chest pain, shortness of breath, lower extremity edema, fatigue, palpitations, melena, hematuria, hemoptysis, diaphoresis, weakness, presyncope, syncope, orthopnea, and PND.   Given past medical history and time since last visit, based on ACC/AHA guidelines, Laura Marshall is at acceptable risk for the planned procedure without further cardiovascular testing.   The patient was advised that if she develops new symptoms prior to surgery to contact our office to arrange for a follow-up visit, and she verbalized understanding.  I will route this recommendation to the requesting party via Epic fax function and remove from pre-op pool.  Please call with questions.  Marquize Seib D Ethan Kasperski, NP 12/14/2023, 11:09 AM

## 2023-12-14 NOTE — Telephone Encounter (Signed)
   Pre-operative Risk Assessment    Patient Name: Laura Marshall  DOB: 07-16-1965 MRN: [id number]   Date of last office visit: 09/11/23 DR. CHANDRASEKHAR Date of next office visit: NONE   Request for Surgical Clearance    Procedure:  BREAST LUMPECTOMY WITH RADIOACTIVE SEED LOCALIZATION  Date of Surgery:  Clearance 12/18/23 URGENT                               Surgeon:  DR. MATTHEW WAKEFIELD Surgeon's Group or Practice Name:  [facility] Phone number:  [phone] Fax number:  [phone] RUSSELL CHRISTINE, CMA   Type of Clearance Requested:   - Medical ; NONE LISTED TO BE HELD   Type of Anesthesia:  General    Additional requests/questions:    [name]   12/14/2023, 10:48 AM

## 2023-12-15 ENCOUNTER — Encounter: Payer: Self-pay | Admitting: *Deleted

## 2023-12-15 ENCOUNTER — Encounter (HOSPITAL_BASED_OUTPATIENT_CLINIC_OR_DEPARTMENT_OTHER)
Admission: RE | Admit: 2023-12-15 | Discharge: 2023-12-15 | Disposition: A | Discharge status: Home / Self Care | Source: Ambulatory Visit | Attending: General Surgery

## 2023-12-15 ENCOUNTER — Ambulatory Visit
Admission: RE | Admit: 2023-12-15 | Discharge: 2023-12-15 | Disposition: A | Discharge status: Home / Self Care | Source: Ambulatory Visit | Attending: General Surgery | Admitting: General Surgery

## 2023-12-15 ENCOUNTER — Other Ambulatory Visit: Payer: Self-pay | Admitting: General Surgery

## 2023-12-15 ENCOUNTER — Telehealth: Payer: Self-pay | Admitting: *Deleted

## 2023-12-15 DIAGNOSIS — Z170 Estrogen receptor positive status [ER+]: Secondary | ICD-10-CM

## 2023-12-15 DIAGNOSIS — Z79890 Hormone replacement therapy (postmenopausal): Secondary | ICD-10-CM | POA: Diagnosis not present

## 2023-12-15 DIAGNOSIS — C50412 Malignant neoplasm of upper-outer quadrant of left female breast: Secondary | ICD-10-CM | POA: Diagnosis not present

## 2023-12-15 DIAGNOSIS — E039 Hypothyroidism, unspecified: Secondary | ICD-10-CM | POA: Diagnosis not present

## 2023-12-15 DIAGNOSIS — I10 Essential (primary) hypertension: Secondary | ICD-10-CM | POA: Diagnosis not present

## 2023-12-15 DIAGNOSIS — Z01812 Encounter for preprocedural laboratory examination: Secondary | ICD-10-CM | POA: Insufficient documentation

## 2023-12-15 DIAGNOSIS — C50912 Malignant neoplasm of unspecified site of left female breast: Secondary | ICD-10-CM | POA: Diagnosis not present

## 2023-12-15 DIAGNOSIS — E119 Type 2 diabetes mellitus without complications: Secondary | ICD-10-CM | POA: Diagnosis not present

## 2023-12-15 DIAGNOSIS — Z1721 Progesterone receptor positive status: Secondary | ICD-10-CM | POA: Diagnosis not present

## 2023-12-15 HISTORY — PX: BREAST BIOPSY: SHX20

## 2023-12-15 LAB — BASIC METABOLIC PANEL WITH GFR
Anion gap: 8 (ref 5–15)
BUN: 12 mg/dL (ref 6–20)
CO2: 29 mmol/L (ref 22–32)
Calcium: 9.5 mg/dL (ref 8.9–10.3)
Chloride: 100 mmol/L (ref 98–111)
Creatinine, Ser: 0.84 mg/dL (ref 0.44–1.00)
GFR, Estimated: >60 mL/min (ref >60)
Glucose, Bld: 92 mg/dL (ref 70–99)
Potassium: 4.1 mmol/L (ref 3.5–5.1)
Sodium: 137 mmol/L (ref 135–145)

## 2023-12-15 MED ORDER — ENSURE PRE-SURGERY PO LIQD: 296.0000 mL | Freq: Once | ORAL | Status: DC

## 2023-12-15 MED ORDER — CHLORHEXIDINE GLUCONATE CLOTH 2 % EX PADS: 6.0000 | MEDICATED_PAD | Freq: Once | CUTANEOUS | Status: DC

## 2023-12-15 NOTE — Progress Notes (Signed)

## 2023-12-15 NOTE — Telephone Encounter (Signed)
Spoke with patient to follow up from new patient appt and assess navigation needs. Patient denies any questions or concerns at this time. Gave contact information and encouraged her to call should anything arise. Patient verbalized understanding.

## 2023-12-18 ENCOUNTER — Ambulatory Visit (HOSPITAL_BASED_OUTPATIENT_CLINIC_OR_DEPARTMENT_OTHER)
Admission: RE | Admit: 2023-12-18 | Discharge: 2023-12-18 | Disposition: A | Discharge status: Home / Self Care | Attending: General Surgery | Admitting: General Surgery

## 2023-12-18 ENCOUNTER — Other Ambulatory Visit: Payer: Self-pay

## 2023-12-18 ENCOUNTER — Encounter (HOSPITAL_BASED_OUTPATIENT_CLINIC_OR_DEPARTMENT_OTHER)
Admission: RE | Disposition: A | Discharge status: Home / Self Care | Payer: Self-pay | Source: Home / Self Care | Attending: General Surgery

## 2023-12-18 ENCOUNTER — Ambulatory Visit (HOSPITAL_BASED_OUTPATIENT_CLINIC_OR_DEPARTMENT_OTHER): Payer: Self-pay | Admitting: Anesthesiology

## 2023-12-18 ENCOUNTER — Encounter (HOSPITAL_BASED_OUTPATIENT_CLINIC_OR_DEPARTMENT_OTHER): Payer: Self-pay | Admitting: General Surgery

## 2023-12-18 ENCOUNTER — Ambulatory Visit
Admission: RE | Admit: 2023-12-18 | Discharge: 2023-12-18 | Disposition: A | Discharge status: Home / Self Care | Source: Ambulatory Visit | Attending: General Surgery | Admitting: General Surgery

## 2023-12-18 DIAGNOSIS — Z170 Estrogen receptor positive status [ER+]: Secondary | ICD-10-CM | POA: Diagnosis not present

## 2023-12-18 DIAGNOSIS — Z1721 Progesterone receptor positive status: Secondary | ICD-10-CM | POA: Diagnosis not present

## 2023-12-18 DIAGNOSIS — R928 Other abnormal and inconclusive findings on diagnostic imaging of breast: Secondary | ICD-10-CM | POA: Diagnosis not present

## 2023-12-18 DIAGNOSIS — Z01818 Encounter for other preprocedural examination: Secondary | ICD-10-CM

## 2023-12-18 DIAGNOSIS — I10 Essential (primary) hypertension: Secondary | ICD-10-CM | POA: Insufficient documentation

## 2023-12-18 DIAGNOSIS — C50412 Malignant neoplasm of upper-outer quadrant of left female breast: Secondary | ICD-10-CM

## 2023-12-18 DIAGNOSIS — E039 Hypothyroidism, unspecified: Secondary | ICD-10-CM | POA: Diagnosis not present

## 2023-12-18 DIAGNOSIS — D0512 Intraductal carcinoma in situ of left breast: Secondary | ICD-10-CM | POA: Diagnosis not present

## 2023-12-18 DIAGNOSIS — Z79890 Hormone replacement therapy (postmenopausal): Secondary | ICD-10-CM | POA: Insufficient documentation

## 2023-12-18 DIAGNOSIS — E119 Type 2 diabetes mellitus without complications: Secondary | ICD-10-CM | POA: Diagnosis not present

## 2023-12-18 DIAGNOSIS — C50912 Malignant neoplasm of unspecified site of left female breast: Secondary | ICD-10-CM | POA: Diagnosis not present

## 2023-12-18 HISTORY — DX: Hypothyroidism, unspecified: E03.9

## 2023-12-18 HISTORY — DX: Other specified postprocedural states: R11.2

## 2023-12-18 HISTORY — DX: Essential (primary) hypertension: I10

## 2023-12-18 HISTORY — PX: BREAST LUMPECTOMY WITH RADIOACTIVE SEED LOCALIZATION: SHX6424

## 2023-12-18 SURGERY — BREAST LUMPECTOMY WITH RADIOACTIVE SEED LOCALIZATION
Anesthesia: General | Site: Breast | Laterality: Left

## 2023-12-18 MED ORDER — ACETAMINOPHEN 500 MG PO TABS: 1000.0000 mg | ORAL_TABLET | Freq: Once | ORAL | Status: AC

## 2023-12-18 MED ORDER — LACTATED RINGERS IV SOLN: INTRAVENOUS | Status: DC

## 2023-12-18 MED ORDER — CHLORHEXIDINE GLUCONATE CLOTH 2 % EX PADS: 6.0000 | MEDICATED_PAD | Freq: Once | CUTANEOUS | Status: DC

## 2023-12-18 MED ORDER — CEFAZOLIN SODIUM-DEXTROSE 2-4 GM/100ML-% IV SOLN
INTRAVENOUS | Status: AC
  Filled 2023-12-18: qty 100

## 2023-12-18 MED ORDER — FENTANYL CITRATE (PF) 100 MCG/2ML IJ SOLN
INTRAMUSCULAR | Status: DC | PRN
  Administered 2023-12-18 (×2): 25 ug via INTRAVENOUS
  Administered 2023-12-18: 50 ug via INTRAVENOUS

## 2023-12-18 MED ORDER — ACETAMINOPHEN 500 MG PO TABS
ORAL_TABLET | ORAL | Status: AC
  Filled 2023-12-18: qty 2

## 2023-12-18 MED ORDER — ACETAMINOPHEN 500 MG PO TABS
1000.0000 mg | ORAL_TABLET | ORAL | Status: AC
  Administered 2023-12-18: 1000 mg via ORAL

## 2023-12-18 MED ORDER — ONDANSETRON HCL 4 MG/2ML IJ SOLN
INTRAMUSCULAR | Status: AC
  Filled 2023-12-18: qty 2

## 2023-12-18 MED ORDER — BUPIVACAINE HCL (PF) 0.25 % IJ SOLN
INTRAMUSCULAR | Status: AC
  Filled 2023-12-18: qty 30

## 2023-12-18 MED ORDER — EPHEDRINE 5 MG/ML INJ
INTRAVENOUS | Status: AC
  Filled 2023-12-18: qty 5

## 2023-12-18 MED ORDER — ACETAMINOPHEN 500 MG PO TABS: 1000.0000 mg | ORAL_TABLET | ORAL | Status: AC

## 2023-12-18 MED ORDER — PROPOFOL 500 MG/50ML IV EMUL
INTRAVENOUS | Status: DC | PRN
  Administered 2023-12-18: 100 ug/kg/min via INTRAVENOUS

## 2023-12-18 MED ORDER — PROPOFOL 10 MG/ML IV BOLUS
INTRAVENOUS | Status: DC | PRN
  Administered 2023-12-18: 200 mg via INTRAVENOUS

## 2023-12-18 MED ORDER — PROPOFOL 10 MG/ML IV BOLUS
INTRAVENOUS | Status: AC
  Filled 2023-12-18: qty 20

## 2023-12-18 MED ORDER — DEXAMETHASONE SODIUM PHOSPHATE 4 MG/ML IJ SOLN
INTRAMUSCULAR | Status: DC | PRN
  Administered 2023-12-18: 5 mg via INTRAVENOUS

## 2023-12-18 MED ORDER — CEFAZOLIN SODIUM-DEXTROSE 2-4 GM/100ML-% IV SOLN: 2.0000 g | INTRAVENOUS | Status: DC

## 2023-12-18 MED ORDER — AMISULPRIDE (ANTIEMETIC) 5 MG/2ML IV SOLN: 10.0000 mg | Freq: Once | INTRAVENOUS | Status: DC | PRN

## 2023-12-18 MED ORDER — FENTANYL CITRATE (PF) 100 MCG/2ML IJ SOLN
INTRAMUSCULAR | Status: AC
Start: 2023-12-18 — End: 2023-12-18
  Filled 2023-12-18: qty 2

## 2023-12-18 MED ORDER — ONDANSETRON HCL 4 MG/2ML IJ SOLN
INTRAMUSCULAR | Status: DC | PRN
  Administered 2023-12-18: 4 mg via INTRAVENOUS

## 2023-12-18 MED ORDER — HYDROMORPHONE HCL 1 MG/ML IJ SOLN: 0.2500 mg | INTRAMUSCULAR | Status: DC | PRN

## 2023-12-18 MED ORDER — ONDANSETRON HCL 4 MG/2ML IJ SOLN: 4.0000 mg | Freq: Once | INTRAMUSCULAR | Status: DC | PRN

## 2023-12-18 MED ORDER — ENSURE PRE-SURGERY PO LIQD: 296.0000 mL | Freq: Once | ORAL | Status: DC

## 2023-12-18 MED ORDER — BUPIVACAINE HCL (PF) 0.25 % IJ SOLN
INTRAMUSCULAR | Status: DC | PRN
  Administered 2023-12-18: 10 mL

## 2023-12-18 MED ORDER — OXYCODONE HCL 5 MG PO TABS: 5.0000 mg | ORAL_TABLET | Freq: Once | ORAL | Status: DC | PRN

## 2023-12-18 MED ORDER — DEXAMETHASONE SODIUM PHOSPHATE 10 MG/ML IJ SOLN
INTRAMUSCULAR | Status: AC
  Filled 2023-12-18: qty 1

## 2023-12-18 MED ORDER — CEFAZOLIN SODIUM-DEXTROSE 2-4 GM/100ML-% IV SOLN
2.0000 g | INTRAVENOUS | Status: AC
  Administered 2023-12-18: 2 g via INTRAVENOUS

## 2023-12-18 MED ORDER — OXYCODONE HCL 5 MG/5ML PO SOLN: 5.0000 mg | Freq: Once | ORAL | Status: DC | PRN

## 2023-12-18 MED ORDER — LIDOCAINE HCL (CARDIAC) PF 100 MG/5ML IV SOSY
PREFILLED_SYRINGE | INTRAVENOUS | Status: DC | PRN
  Administered 2023-12-18: 60 mg via INTRAVENOUS

## 2023-12-18 MED ORDER — FENTANYL CITRATE (PF) 100 MCG/2ML IJ SOLN
INTRAMUSCULAR | Status: AC
  Filled 2023-12-18: qty 2

## 2023-12-18 MED ORDER — EPHEDRINE SULFATE (PRESSORS) 50 MG/ML IJ SOLN
INTRAMUSCULAR | Status: DC | PRN
  Administered 2023-12-18: 5 mg via INTRAVENOUS
  Administered 2023-12-18 (×2): 10 mg via INTRAVENOUS

## 2023-12-18 SURGICAL SUPPLY — 42 items
BINDER BREAST LRG (GAUZE/BANDAGES/DRESSINGS) IMPLANT
BINDER BREAST XLRG (GAUZE/BANDAGES/DRESSINGS) IMPLANT
BLADE SURG 15 STRL LF DISP TIS (BLADE) ×1 IMPLANT
CANISTER SUC SOCK COL 7IN (MISCELLANEOUS) IMPLANT
CANISTER SUCT 1200ML W/VALVE (MISCELLANEOUS) IMPLANT
CHLORAPREP W/TINT 26 (MISCELLANEOUS) ×1 IMPLANT
CLIP TI WIDE RED SMALL 6 (CLIP) IMPLANT
COVER BACK TABLE 60X90IN (DRAPES) ×1 IMPLANT
COVER MAYO STAND STRL (DRAPES) ×1 IMPLANT
COVER PROBE CYLINDRICAL 5X96 (MISCELLANEOUS) ×1 IMPLANT
DERMABOND ADVANCED .7 DNX12 (GAUZE/BANDAGES/DRESSINGS) ×1 IMPLANT
DRAPE LAPAROSCOPIC ABDOMINAL (DRAPES) ×1 IMPLANT
DRAPE UTILITY XL STRL (DRAPES) ×1 IMPLANT
DRSG TEGADERM 4X4.75 (GAUZE/BANDAGES/DRESSINGS) IMPLANT
ELECT COATED BLADE 2.86 ST (ELECTRODE) ×1 IMPLANT
ELECTRODE REM PT RTRN 9FT ADLT (ELECTROSURGICAL) ×1 IMPLANT
GAUZE SPONGE 4X4 12PLY STRL LF (GAUZE/BANDAGES/DRESSINGS) IMPLANT
GLOVE BIOGEL PI IND STRL 7.5 (GLOVE) ×1 IMPLANT
GLOVE SURG SS PI 7.0 STRL IVOR (GLOVE) IMPLANT
GOWN STRL REUS W/ TWL LRG LVL3 (GOWN DISPOSABLE) ×2 IMPLANT
HEMOSTAT ARISTA ABSORB 3G PWDR (HEMOSTASIS) IMPLANT
KIT MARKER MARGIN INK (KITS) ×1 IMPLANT
NDL HYPO 25X1 1.5 SAFETY (NEEDLE) ×1 IMPLANT
NEEDLE HYPO 25X1 1.5 SAFETY (NEEDLE) ×1 IMPLANT
NS IRRIG 1000ML POUR BTL (IV SOLUTION) IMPLANT
PACK BASIN DAY SURGERY FS (CUSTOM PROCEDURE TRAY) ×1 IMPLANT
PENCIL SMOKE EVACUATOR (MISCELLANEOUS) ×1 IMPLANT
RETRACTOR ONETRAX LX 90X20 (MISCELLANEOUS) IMPLANT
SLEEVE SCD COMPRESS KNEE MED (STOCKING) ×1 IMPLANT
SPONGE T-LAP 4X18 ~~LOC~~+RFID (SPONGE) ×1 IMPLANT
STRIP CLOSURE SKIN 1/2X4 (GAUZE/BANDAGES/DRESSINGS) ×1 IMPLANT
SUT MNCRL AB 4-0 PS2 18 (SUTURE) ×1 IMPLANT
SUT MON AB 5-0 PS2 18 (SUTURE) IMPLANT
SUT SILK 2 0 SH (SUTURE) IMPLANT
SUT VIC AB 2-0 SH 27XBRD (SUTURE) ×1 IMPLANT
SUT VIC AB 3-0 SH 27X BRD (SUTURE) ×1 IMPLANT
SUT VIC AB 5-0 PS2 18 (SUTURE) IMPLANT
SYR CONTROL 10ML LL (SYRINGE) ×1 IMPLANT
TOWEL GREEN STERILE FF (TOWEL DISPOSABLE) ×1 IMPLANT
TRAY FAXITRON CT DISP (TRAY / TRAY PROCEDURE) ×1 IMPLANT
TUBE CONNECTING 20X1/4 (TUBING) IMPLANT
YANKAUER SUCT BULB TIP NO VENT (SUCTIONS) IMPLANT

## 2023-12-18 NOTE — Anesthesia Procedure Notes (Signed)
 Procedure Name: LMA Insertion Date/Time: 12/18/2023 11:11 AM  Performed by: Pam Macario BROCKS, CRNAPre-anesthesia Checklist: Patient identified, Emergency Drugs available, Suction available, Patient being monitored and Timeout performed Patient Re-evaluated:Patient Re-evaluated prior to induction Oxygen Delivery Method: Circle system utilized Preoxygenation: Pre-oxygenation with 100% oxygen Induction Type: IV induction Ventilation: Mask ventilation without difficulty LMA: LMA inserted LMA Size: 4.0 Number of attempts: 1 Airway Equipment and Method: Bite block Placement Confirmation: positive ETCO2, breath sounds checked- equal and bilateral and CO2 detector Tube secured with: Tape Dental Injury: Teeth and Oropharynx as per pre-operative assessment

## 2023-12-18 NOTE — H&P (Signed)
 35 yof with prior breast augmentation in 1999 presents with new left breast cancer. She has no other breast history. No mass or dc. She had sceening mm that shows a left breast mass. Does not have dense breasts. She has us  that shows a 1.6 cm mass in lateral left breast without any lymphadenopathy. 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 She has panel genetics several years ago due to fh of breast and ovarian cancer that were negative. She is here to discuss options.   Review of Systems: A complete review of systems was obtained from the patient. I have reviewed this information and discussed as appropriate with the patient. See HPI as well for other ROS.  Review of Systems  Psychiatric/Behavioral: The patient is nervous/anxious.  All other systems reviewed and are negative.  Medical History: Past Medical History:  Diagnosis Date  Diabetes mellitus without complication (CMS/HHS-HCC)  History of cancer  Thyroid  disease    Past Surgical History:  Procedure Laterality Date  AUGMENTATION MAMMAPLASTY   Allergies Allergen Reactions  Minocycline Anaphylaxis, Hives, Other (See Comments), Rash, Shortness Of Breath and Swelling  hives   Current Outpatient Medications on File Prior to Visit  Medication Sig Dispense Refill  ARMOUR THYROID  15 mg tablet TAKE 1 TABLET BY MOUTH EVERY DAY ON EMPTY STOMACH  rosuvastatin (CRESTOR) 5 MG tablet Take 5 mg by mouth once daily   Family History  Problem Relation Age of Onset  Hyperlipidemia (Elevated cholesterol) Mother  Diabetes Father  Myocardial Infarction (Heart attack) Father  Diabetes Brother   Social History   Tobacco Use  Smoking Status Never  Smokeless Tobacco Never  Marital status: Married  Tobacco Use  Smoking status: Never  Smokeless tobacco: Never   Objective:   Vitals:  12/06/23 0808  Weight: 65.1 kg (143 lb 9.6 oz)  Height: 159.4 cm (5' 2.75)  PainSc: 0-No pain   Body mass index is 25.64  kg/m.  Physical Exam Vitals reviewed.  Constitutional:  Appearance: Normal appearance.  Chest:  Breasts: Right: No inverted nipple, mass or nipple discharge.  Left: No inverted nipple, mass or nipple discharge.  Comments: Bilateral implants Lymphadenopathy:  Upper Body:  Right upper body: No supraclavicular or axillary adenopathy.  Left upper body: No supraclavicular or axillary adenopathy.  Neurological:  Mental Status: She is alert.   Assessment and Plan:   Malignant neoplasm of upper-outer quadrant of left breast in female, estrogen receptor positive (CMS/HHS-HCC)  Left breast lumpectomy  We discussed the staging and pathophysiology of breast cancer. We discussed all of the different options for treatment for breast cancer including surgery, chemotherapy, radiation therapy, and antiestrogen therapy.  We discussed a sentinel lymph node biopsy today with pros and cons. She is 58 year old female with stage I er/pr pos tumor. We discussed sn as standard in past but this is in flux. I think at 45 with this tumor we could omit sn biopsy. We discussed risks and benefits as well as INSEMA and SOUND trials. I will have her see rad onc and med onc but I think we have decided to omit sn biopsy. She understands that this guideline is not agreed upon by everyone.  We discussed oncotype after surgery.  We discussed the options for treatment of the breast cancer which included lumpectomy versus a mastectomy. We discussed the performance of the lumpectomy with radioactive seed placement. We discussed a 5-10% chance of a positive margin requiring reexcision in the  operating room. We also discussed that she will lneed radiation therapy if she undergoes lumpectomy. We discussed mastectomy and the postoperative care for that as well. Mastectomy can be followed by reconstruction. The decision for lumpectomy vs mastectomy has no impact on decision for chemotherapy. Most mastectomy patients will not need  radiation therapy. We discussed that there is no difference in her survival whether she undergoes lumpectomy with radiation therapy or antiestrogen therapy versus a mastectomy. There is also no real difference between her recurrence in the breast. We discussed the risks of operation including bleeding, infection, possible reoperation. She understands her further therapy will be based on what her stages at the time of her operation.  Will also have her see plastic surgery to discuss implants and possible exchange.  I dont think needs contrast imaging due to breast density.

## 2023-12-18 NOTE — Interval H&P Note (Signed)
 History and Physical Interval Note:  12/18/2023 11:08 AM  Laura Marshall  has presented today for surgery, with the diagnosis of LEFT BREAST CANCER.  The various methods of treatment have been discussed with the patient and family. After consideration of risks, benefits and other options for treatment, the patient has consented to  Procedure(s): BREAST LUMPECTOMY WITH RADIOACTIVE SEED LOCALIZATION (Left) as a surgical intervention.  The patient's history has been reviewed, patient examined, no change in status, stable for surgery.  I have reviewed the patient's chart and labs.  Questions were answered to the patient's satisfaction.     Donnice Bury

## 2023-12-18 NOTE — Anesthesia Postprocedure Evaluation (Signed)
 Anesthesia Post Note  Patient: Laura Marshall  Procedure(s) Performed: BREAST LUMPECTOMY WITH RADIOACTIVE SEED LOCALIZATION (Left: Breast)     Patient location during evaluation: PACU Anesthesia Type: General Level of consciousness: awake and alert, oriented and patient cooperative Pain management: pain level controlled Vital Signs Assessment: post-procedure vital signs reviewed and stable Respiratory status: spontaneous breathing, nonlabored ventilation and respiratory function stable Cardiovascular status: blood pressure returned to baseline and stable Postop Assessment: no apparent nausea or vomiting Anesthetic complications: no   No notable events documented.  Last Vitals:  Vitals:   12/18/23 1230 12/18/23 1242  BP: 118/75 122/73  Pulse: 60 65  Resp: 10 16  Temp:  (!) 36.3 C  SpO2: 96% 96%    Last Pain:  Vitals:   12/18/23 1242  TempSrc:   PainSc: 3                  Almarie CHRISTELLA Marchi

## 2023-12-18 NOTE — Op Note (Signed)
 Preoperative diagnosis: Clinical stage I left breast cancer Postoperative diagnosis: Same as above Procedure: Left breast radioactive seed guided lumpectomy Surgeon: Dr. Adina Bury Anesthesia: General Complications: None Drains: None Specimens: 1.  Right breast tissue marked with paint containing seed and clip 2.  Additional medial, inferior, superior, inferior margins marked short stitch superior, long stitch lateral, double stitch deep Sponge needle count was correct completion Disposition recovery stable addition  Indications: This is a healthy 58 year old female with a prior history of a breast augmentation in 1999 who underwent a screening mammogram that showed a 1.6 cm lateral left breast mass.  She has no lymphadenopathy.  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  We discussed all of her options in multidisciplinary fashion elected proceed with lumpectomy alone.  Procedure: After informed consent was obtained she was taken to the operating room.  She had had the radioactive seed placed previously.  I had these mammograms in the operating room.  She was given antibiotics.  SCDs were placed.  She was placed under general anesthesia without complication.  She was prepped and draped in standard sterile surgical fashion.  Surgical timeout was then performed.  I located the seed in the lateral breast.  It was fairly close to the skin so I elected to infiltrate Marcaine and make a curvilinear incision overlying the seed and the tumor.  I then used the neoprobe to identify the seed.  I remove the seed in the surrounding tissue in order to get a clear margin.  The posterior margin is now the muscle as well as the implant capsule.  The anterior margin is really her skin at this point.  I did a mammogram which confirmed removal of the seed and the clip.  It looks like might be close to several margins due to some spiculations of the tumor so I remove these as above.  I then  obtained hemostasis.  I placed a single tiny clip in the cavity.  I then brought the breast tissue together with 2-0 Vicryl.  The skin was closed with 3-0 Vicryl for Monocryl.  Glue and Steri-Strips were applied.  She tolerated this well was extubated and transferred recovery stable.

## 2023-12-18 NOTE — Transfer of Care (Signed)
 Immediate Anesthesia Transfer of Care Note  Patient: Laura Marshall  Procedure(s) Performed: BREAST LUMPECTOMY WITH RADIOACTIVE SEED LOCALIZATION (Left: Breast)  Patient Location: PACU  Anesthesia Type:General  Level of Consciousness: awake and drowsy  Airway & Oxygen Therapy: Patient Spontanous Breathing  Post-op Assessment: Report given to RN  Post vital signs: Reviewed and stable  Last Vitals:  Vitals Value Taken Time  BP 118/71 12/18/23 12:02  Temp    Pulse 63 12/18/23 12:03  Resp    SpO2 98 % 12/18/23 12:03  Vitals shown include unfiled device data.  Last Pain:  Vitals:   12/18/23 1014  TempSrc: Temporal  PainSc: 0-No pain      Patients Stated Pain Goal: 4 (12/18/23 1014)  Complications: No notable events documented.

## 2023-12-18 NOTE — Anesthesia Preprocedure Evaluation (Addendum)
 Anesthesia Evaluation  Patient identified by MRN, date of birth, ID band Patient awake    Reviewed: Allergy & Precautions, NPO status , Patient's Chart, lab work & pertinent test results  History of Anesthesia Complications (+) PONV and history of anesthetic complications  Airway Mallampati: II  TM Distance: >3 FB Neck ROM: Full    Dental  (+) Teeth Intact, Dental Advisory Given   Pulmonary neg pulmonary ROS   Pulmonary exam normal breath sounds clear to auscultation       Cardiovascular hypertension (117/72 no longer on meds), Normal cardiovascular exam Rhythm:Regular Rate:Normal  hx obstructive hypertrophic cardiomyopathy previously noted to have severe LVOT gradient that resolved with stopping of hydrochlorothiazide, asymptomatic paroxysmal SVT noted on cardiac monitor.    Echo 08/2023  1. There is no systolic anterior motion of the mitral valve and there is  no outflow obstruction at rest or following the Valsalva maneuver. Left  ventricular ejection fraction, by estimation, is 65 to 70%. Left  ventricular ejection fraction by 3D volume  is 79 %. The left ventricle has normal function. The left ventricle has no  regional wall motion abnormalities. Left ventricular diastolic parameters  are consistent with Grade II diastolic dysfunction (pseudonormalization).  Elevated left atrial pressure.   2. Right ventricular systolic function is normal. The right ventricular  size is normal.   3. Left atrial size was mildly dilated.   4. The mitral valve is normal in structure. Trivial mitral valve  regurgitation. No evidence of mitral stenosis.   5. The aortic valve is tricuspid. There is mild calcification of the  aortic valve. Aortic valve regurgitation is not visualized. Aortic valve  sclerosis/calcification is present, without any evidence of aortic  stenosis.   6. There is mild dilatation of the ascending aorta, measuring 41 mm.      Neuro/Psych  PSYCHIATRIC DISORDERS Anxiety Depression    negative neurological ROS     GI/Hepatic negative GI ROS, Neg liver ROS,,,  Endo/Other  diabetesHypothyroidism    Renal/GU negative Renal ROS  negative genitourinary   Musculoskeletal negative musculoskeletal ROS (+)    Abdominal   Peds  Hematology negative hematology ROS (+)   Anesthesia Other Findings   Reproductive/Obstetrics negative OB ROS                             Anesthesia Physical Anesthesia Plan  ASA: 2  Anesthesia Plan: General   Post-op Pain Management: Tylenol PO (pre-op)*   Induction: Intravenous  PONV Risk Score and Plan: 4 or greater and Ondansetron, Dexamethasone, Midazolam, Treatment may vary due to age or medical condition, Propofol infusion and TIVA  Airway Management Planned: LMA  Additional Equipment: None  Intra-op Plan:   Post-operative Plan: Extubation in OR  Informed Consent: I have reviewed the patients History and Physical, chart, labs and discussed the procedure including the risks, benefits and alternatives for the proposed anesthesia with the patient or authorized representative who has indicated his/her understanding and acceptance.     Dental advisory given  Plan Discussed with: CRNA  Anesthesia Plan Comments:        Anesthesia Quick Evaluation

## 2023-12-18 NOTE — Discharge Instructions (Addendum)
 [facility] Office Phone Number [phone]  POST OP INSTRUCTIONS Take 400 mg of ibuprofen every 8 hours or 650 mg tylenol every 6 hours for next 72 hours then as needed. Use ice several times daily also.  A prescription for pain medication may be given to you upon discharge.  Take your pain medication as prescribed, if needed.  If narcotic pain medicine is not needed, then you may take acetaminophen (Tylenol), naprosyn (Alleve) or ibuprofen (Advil) as needed. Take your usually prescribed medications unless otherwise directed If you need a refill on your pain medication, please contact your pharmacy.  They will contact our office to request authorization.  Prescriptions will not be filled after 5pm or on week-ends. You should eat very light the first 24 hours after surgery, such as soup, crackers, pudding, etc.  Resume your normal diet the day after surgery. Most patients will experience some swelling and bruising in the breast.  Ice packs and a good support bra will help.  Wear the breast binder provided or a sports bra for 72 hours day and night.  After that wear a sports bra during the day until you return to the office. Swelling and bruising can take several days to resolve.  It is common to experience some constipation if taking pain medication after surgery.  Increasing fluid intake and taking a stool softener will usually help or prevent this problem from occurring.  A mild laxative (Milk of Magnesia or Miralax) should be taken according to package directions if there are no bowel movements after 48 hours. I used skin glue on the incision, you may shower in 24 hours.  The glue will flake off over the next 2-3 weeks.  Any sutures or staples will be removed at the office during your follow-up visit. ACTIVITIES:  You may resume regular daily activities (gradually increasing) beginning the next day.  Wearing a good support bra or sports bra minimizes pain and swelling.  You may have  sexual intercourse when it is comfortable. You may drive when you no longer are taking prescription pain medication, you can comfortably wear a seatbelt, and you can safely maneuver your car and apply brakes. RETURN TO WORK:  ______________________________________________________________________________________ Laura Marshall should see your doctor in the office for a follow-up appointment approximately two weeks after your surgery.  Your doctor's nurse will typically make your follow-up appointment when she calls you with your pathology report.  Expect your pathology report 3-4 business days after your surgery.  You may call to check if you do not hear from us  after three days. OTHER INSTRUCTIONS: _______________________________________________________________________________________________ _____________________________________________________________________________________________________________________________________ _____________________________________________________________________________________________________________________________________ _____________________________________________________________________________________________________________________________________  WHEN TO CALL Laura Marshall: Fever over 101.0 Nausea and/or vomiting. Extreme swelling or bruising. Continued bleeding from incision. Increased pain, redness, or drainage from the incision.  The clinic staff is available to answer your questions during regular business hours.  Please don't hesitate to call and ask to speak to one of the nurses for clinical concerns.  If you have a medical emergency, go to the nearest emergency room or call 911.  A surgeon from [facility] is always on call at the hospital.  For further questions, please visit [email or web address]  Post Anesthesia Home Care Instructions  Activity: Get plenty of rest for the remainder of the day. A responsible individual must stay with  you for 24 hours following the procedure.  For the next 24 hours, DO NOT: -Drive a car -Advertising copywriter -Drink alcoholic beverages -Take any medication unless instructed by your  physician -Make any legal decisions or sign important papers.  Meals: Start with liquid foods such as gelatin or soup. Progress to regular foods as tolerated. Avoid greasy, spicy, heavy foods. If nausea and/or vomiting occur, drink only clear liquids until the nausea and/or vomiting subsides. Call your physician if vomiting continues.  Special Instructions/Symptoms: Your throat may feel dry or sore from the anesthesia or the breathing tube placed in your throat during surgery. If this causes discomfort, gargle with warm salt water. The discomfort should disappear within 24 hours.  If you had a scopolamine patch placed behind your ear for the management of post- operative nausea and/or vomiting:  1. The medication in the patch is effective for 72 hours, after which it should be removed.  Wrap patch in a tissue and discard in the trash. Wash hands thoroughly with soap and water. 2. You may remove the patch earlier than 72 hours if you experience unpleasant side effects which may include dry mouth, dizziness or visual disturbances. 3. Avoid touching the patch. Wash your hands with soap and water after contact with the patch.       Next dose of tylenol may be taken at 4:30

## 2023-12-19 ENCOUNTER — Encounter (HOSPITAL_BASED_OUTPATIENT_CLINIC_OR_DEPARTMENT_OTHER): Payer: Self-pay | Admitting: General Surgery

## 2023-12-20 LAB — SURGICAL PATHOLOGY

## 2023-12-21 ENCOUNTER — Telehealth: Payer: Self-pay | Admitting: *Deleted

## 2023-12-21 ENCOUNTER — Encounter: Payer: Self-pay | Admitting: *Deleted

## 2023-12-21 NOTE — Telephone Encounter (Signed)
 Ordered oncotype per Dr. Pamelia Hoit. Sent requisition to pathology and exact sciences.

## 2024-01-02 DIAGNOSIS — C50412 Malignant neoplasm of upper-outer quadrant of left female breast: Secondary | ICD-10-CM | POA: Diagnosis not present

## 2024-01-02 DIAGNOSIS — Z170 Estrogen receptor positive status [ER+]: Secondary | ICD-10-CM | POA: Diagnosis not present

## 2024-01-04 ENCOUNTER — Telehealth: Payer: Self-pay | Admitting: *Deleted

## 2024-01-04 ENCOUNTER — Encounter: Payer: Self-pay | Admitting: *Deleted

## 2024-01-04 DIAGNOSIS — Z170 Estrogen receptor positive status [ER+]: Secondary | ICD-10-CM

## 2024-01-04 NOTE — Telephone Encounter (Signed)
 Received oncotype results 18/5%. Patient aware. Referral placed for Dr. Izell.

## 2024-01-05 ENCOUNTER — Encounter (HOSPITAL_COMMUNITY): Payer: Self-pay

## 2024-01-10 NOTE — Assessment & Plan Note (Signed)
 1999: Breast augmentation 11/10/2022: Screening mammogram detected left breast mass 1.6 cm biopsy: Grade 1 IDC ER 99%/PR 50% HER2: 2+ by IHC FISH: Negative (biopsy done at [facility])  Recommendations: 1. Breast conserving surgery agree with Dr. Ebbie that the lymph node biopsy is not necessary given the favorable nature for breast cancer) 2. Oncotype DX testing to determine if chemotherapy would be of any benefit followed by 3. Adjuvant radiation therapy followed by 4. Adjuvant antiestrogen therapy

## 2024-01-11 ENCOUNTER — Inpatient Hospital Stay: Attending: Hematology and Oncology | Admitting: Hematology and Oncology

## 2024-01-11 VITALS — BP 125/68 | HR 64 | Temp 98.3°F | Resp 18 | Ht 63.0 in | Wt 140.7 lb

## 2024-01-11 DIAGNOSIS — C50412 Malignant neoplasm of upper-outer quadrant of left female breast: Secondary | ICD-10-CM | POA: Insufficient documentation

## 2024-01-11 DIAGNOSIS — Z170 Estrogen receptor positive status [ER+]: Secondary | ICD-10-CM | POA: Insufficient documentation

## 2024-01-11 NOTE — Progress Notes (Signed)
 Patient Care Team: Waddell Rake, MD as PCP - General (Family Medicine) Santo Stanly LABOR, MD as PCP - Cardiology (Cardiology) Tyree Nanetta SAILOR, RN as Oncology Nurse Navigator Odean Potts, MD as Consulting Physician (Hematology and Oncology) Curvin Deward MOULD, MD as Consulting Physician (General Surgery)  DIAGNOSIS:  Encounter Diagnosis  Name Primary?   Malignant neoplasm of upper-outer quadrant of left breast in female, estrogen receptor positive (HCC) Yes    SUMMARY OF ONCOLOGIC HISTORY: Oncology History  Malignant neoplasm of upper-outer quadrant of left breast in female, estrogen receptor positive (HCC)  11/10/2022 Initial Diagnosis   Screening mammogram detected left breast mass 1.6 cm biopsy: Grade 1 IDC ER 99%/PR 50% HER2: 2+ by IHC FISH: Negative (biopsy done at [facility])   12/12/2023 Cancer Staging   Staging form: Breast, AJCC 8th Edition - Clinical stage from 12/12/2023: Stage IA (cT1c, cN0, cM0, G1, ER+, PR+, HER2-) - Signed by Izell Domino, MD on 12/12/2023 Stage prefix: Initial diagnosis Histologic grading system: 3 grade system     CHIEF COMPLIANT: Follow-up to discuss adjuvant treatment options  HISTORY OF PRESENT ILLNESS:   History of Present Illness Laura Marshall is a 58 year old female with ductal type breast cancer who presents for post-surgical follow-up and discussion of treatment options.  She underwent surgery for ductal type breast cancer, which was estrogen positive, progesterone positive, and HER2 negative. The tumor measured 1.9 centimeters and was classified as stage 1A. Additional tissue removed during surgery was benign.  The oncotype test score is 18. Anastrozole, a 1 mg daily medication for 5 to 7 years, is considered to reduce estrogen production. Potential side effects include hot flashes, joint stiffness, vaginal dryness, hair thinning, and weight gain.  She inquires about the necessity and benefits of radiation  therapy post-lumpectomy, particularly concerning local recurrence rates. She is exploring alternative methods and lifestyle changes, such as diet and exercise, to complement her treatment plan.       ALLERGIES:  is allergic to fire ant, latex, minocin [minocycline hcl], monosodium glutamate, and sulfamethoxazole-trimethoprim.  MEDICATIONS:  Current Outpatient Medications  Medication Sig Dispense Refill   ARMOUR THYROID  15 MG tablet daily.     cholecalciferol (VITAMIN D3) 25 MCG (1000 UNIT) tablet Take 1,000 Units by mouth daily.     hydrochlorothiazide (HYDRODIURIL) 12.5 MG tablet Take 12.5 mg by mouth daily.     Prenatal Multivit-Min-Fe-FA (PRENATAL VITAMINS PO) Take 1 tablet by mouth daily.     rosuvastatin (CRESTOR) 5 MG tablet Take 5 mg by mouth daily.     tretinoin (RETIN-A) 0.025 % cream 3 (three) times a week.     No current facility-administered medications for this visit.    PHYSICAL EXAMINATION: ECOG PERFORMANCE STATUS: 1 - Symptomatic but completely ambulatory  Vitals:   01/11/24 1039  BP: 125/68  Pulse: 64  Resp: 18  Temp: 98.3 F (36.8 C)  SpO2: 100%   Filed Weights   01/11/24 1039  Weight: 140 lb 11.2 oz (63.8 kg)      LABORATORY DATA:  I have reviewed the data as listed    Latest Ref Rng & Units 12/15/2023   10:00 AM  CMP  Glucose 70 - 99 mg/dL 92   BUN 6 - 20 mg/dL 12   Creatinine 9.55 - 1.00 mg/dL 9.15   Sodium 864 - 854 mmol/L 137   Potassium 3.5 - 5.1 mmol/L 4.1   Chloride 98 - 111 mmol/L 100   CO2 22 - 32 mmol/L 29  Calcium 8.9 - 10.3 mg/dL 9.5     Lab Results  Component Value Date   WBC 5.2 11/15/2022   HGB 14.2 11/15/2022   HCT 41.0 11/15/2022   MCV 90 11/15/2022   PLT 217 11/15/2022    ASSESSMENT & PLAN:  Malignant neoplasm of upper-outer quadrant of left breast in female, estrogen receptor positive (HCC) 1999: Breast augmentation 11/10/2022: Screening mammogram detected left breast mass 1.6 cm biopsy: Grade 1 IDC ER 99%/PR 50%  HER2: 2+ by IHC FISH: Negative (biopsy done at [facility])  Recommendations: 1.  12/18/2023: Left lumpectomy: Grade 3 IDC with DCIS, margins -1.9 cm, ER 99%, PR 50%, HER2 IHC 2+, FISH negative 2. Oncotype DX: 18 (distant recurrence at 9 years: 5%) 3. Adjuvant radiation therapy (patient has appointments to see radiation oncology.  She is discussing about proton beam therapy as well as accelerated hypofractionated regimens) 4. Adjuvant antiestrogen therapy with anastrozole 1 mg daily x 5 to 7 years  Patient inquired about avoiding radiation or antiestrogen therapy.  I strongly suggested that she receive these treatments to prevent recurrences.  She understands that radiation is for local recurrence prevention and the antiestrogen therapies for distant recurrence prevention.   We will enroll her in guardant for MRD testing once she finishes with radiation.     No orders of the defined types were placed in this encounter.  The patient has a good understanding of the overall plan. she agrees with it. she will call with any problems that may develop before the next visit here. Total time spent: 30 mins including face to face time and time spent for planning, charting and co-ordination of care   [name], MD 01/11/24

## 2024-01-12 ENCOUNTER — Telehealth: Payer: Self-pay | Admitting: Radiation Oncology

## 2024-01-12 NOTE — Telephone Encounter (Signed)
 Left message for patient to call back to schedule consult per 7/17 referral.

## 2024-01-15 ENCOUNTER — Telehealth: Payer: Self-pay | Admitting: Radiation Oncology

## 2024-01-15 NOTE — Telephone Encounter (Signed)
 Left message for patient to call back to schedule consult per 7/17 referral.

## 2024-01-17 ENCOUNTER — Telehealth: Payer: Self-pay | Admitting: Radiation Oncology

## 2024-01-17 NOTE — Telephone Encounter (Signed)
 Left message for patient to call back to schedule consult per 7/17 referral.

## 2024-01-18 ENCOUNTER — Encounter: Payer: Self-pay | Admitting: Hematology and Oncology

## 2024-01-18 ENCOUNTER — Encounter: Payer: Self-pay | Admitting: *Deleted

## 2024-01-24 NOTE — Progress Notes (Signed)
 Location of Breast Cancer: Malignant Neoplasm of Upper-Outer Quadrant of Left Breast, Estrogen Receptor Positive   Histology per Pathology Report:   FINAL MICROSCOPIC DIAGNOSIS:  A. BREAST, LEFT, LUMPECTOMY: - Invasive carcinoma of no special type (ductal). - Ductal carcinoma in situ (DCIS). - See cancer summary below. - Biopsy site change with clip. - Radioactive seed.  B. BREAST, LEFT ADDITIONAL MEDIAL MARGIN, EXCISION: - Benign breast tissue. - Negative for malignancy. - See cancer summary below.  C. BREAST, LEFT ADDITIONAL SUPERIOR MARGIN, EXCISION: - Benign breast tissue. - Negative for malignancy. - See cancer summary below.  D. BREAST, LEFT ADDITIONAL LATERAL MARGIN, EXCISION: - Benign breast tissue. - Negative for malignancy. - See cancer summary below.  E. BREAST, LEFT ADDITIONAL INFERIOR MARGIN, EXCISION: - Benign breast tissue. - Negative for malignancy. - See cancer summary below.    Receptor Status: ER(99%), PR (50%),   Did patient present with symptoms (if so, please note symptoms) or was this found on screening mammography?:  Mammogram   Past/Anticipated interventions by surgeon, if jwb:Amzjdu Lumpectomy with Radioactive Seed  localization  Past/Anticipated interventions by medical oncology, if any:  Gudena, MD Recommendations: 1.  12/18/2023: Left lumpectomy: Grade 3 IDC with DCIS, margins -1.9 cm, ER 99%, PR 50%, HER2 IHC 2+, FISH negative 2. Oncotype DX: 18 (distant recurrence at 9 years: 5%) 3. Adjuvant radiation therapy (patient has appointments to see radiation oncology.  She is discussing about proton beam therapy as well as accelerated hypofractionated regimens) 4. Adjuvant antiestrogen therapy with anastrozole 1 mg daily x 5 to 7 years   Patient inquired about avoiding radiation or antiestrogen therapy.  I strongly suggested that she receive these treatments to prevent recurrences.  She understands that radiation is for local recurrence  prevention and the antiestrogen therapies for distant recurrence prevention.   We will enroll her in guardant for MRD testing once she finishes with radiation. Lymphedema issues, if any:    None   Pain issues, if any:   Soreness but denies any pain  SAFETY ISSUES: Prior radiation? None Pacemaker/ICD? None Possible current pregnancy?None Is the patient on methotrexate? None  Current Complaints / other details:   None

## 2024-01-27 NOTE — Progress Notes (Signed)
 Radiation Oncology         [phone] ________________________________  Name: Laura Marshall MRN: [id number]  Date: 01/29/2024  DOB: Nov 20, 1965  Follow-Up Visit Note  Outpatient  CC: Waddell Rake, MD  Odean Potts, MD  Diagnosis:   No diagnosis found.   Stage IA (cT1c, cN0, cM0) Left Breast UOQ, Invasive ductal carcinoma with intermediate grade DCIS, ER+ / PR+ / Her2-, Grade 3: s/p left breast lumpectomy without nodal biopsies    Cancer Staging  Malignant neoplasm of upper-outer quadrant of left breast in female, estrogen receptor positive (HCC) Staging form: Breast, AJCC 8th Edition - Clinical stage from 12/12/2023: Stage IA (cT1c, cN0, cM0, G1, ER+, PR+, HER2-) - Signed by Izell Domino, MD on 12/12/2023   CHIEF COMPLAINT: Here to discuss management of left breast cancer  Narrative:  The patient returns today for follow-up.     Since her consultation date of 12/12/23, she opted to proceed with a left breast lumpectomy without nodal biopsies on 12/18/23 under the care of Dr. Ebbie. Pathology from the procedure revealed: tumor size of 19 mm; histology of grade 3 invasive ductal carcinoma with intermediate grade DCIS; all margins negative for invasive and in situ carcinoma; margin status to invasive disease of 2 mm from the anterior and posterior margins; margin status to in situ disease of 1 mm from the posterior margin; no lymph nodes were examined;  ER status: 99% positive; PR status 50% positive; Her2 status negative; Grade 3.  Oncotype DX was obtained on the final surgical sample and showed a recurrence score of 18 which predicts a risk of recurrence outside the breast over the next 9 years of 5%, if the patient's only systemic therapy were to be an antiestrogen for 5 years.  It also predicts no significant benefit from chemotherapy.   She was also seen in consultation by Dr. Odean on 12/12/23 and most recently followed up with him on 01/11/24 to discuss adjuvant  treatment options. She has agreed to proceed with antiestrogen therapy consisting of anastrozole after completing radiation therapy. As noted above, there is not role for adjuvant chemotherapy based on her Oncotype results. Dr. Gudena would also like to enroll her in guardant for MRD testing once she finishes radiation therapy. .   Symptomatically, the patient reports: ***        ALLERGIES:  is allergic to fire ant, latex, minocin [minocycline hcl], monosodium glutamate, and sulfamethoxazole-trimethoprim.  Meds: Current Outpatient Medications  Medication Sig Dispense Refill   ARMOUR THYROID  15 MG tablet daily.     cholecalciferol (VITAMIN D3) 25 MCG (1000 UNIT) tablet Take 1,000 Units by mouth daily.     hydrochlorothiazide (HYDRODIURIL) 12.5 MG tablet Take 12.5 mg by mouth daily.     Prenatal Multivit-Min-Fe-FA (PRENATAL VITAMINS PO) Take 1 tablet by mouth daily.     rosuvastatin (CRESTOR) 5 MG tablet Take 5 mg by mouth daily.     tretinoin (RETIN-A) 0.025 % cream 3 (three) times a week.     No current facility-administered medications for this encounter.    Physical Findings:  vitals were not taken for this visit. .     General: Alert and oriented, in no acute distress HEENT: Head is normocephalic. Extraocular movements are intact. Oropharynx is clear. Neck: Neck is supple, no palpable cervical or supraclavicular lymphadenopathy. Heart: Regular in rate and rhythm with no murmurs, rubs, or gallops. Chest: Clear to auscultation bilaterally, with no rhonchi, wheezes, or rales. Abdomen: Soft, nontender, nondistended, with no rigidity or  guarding. Extremities: No cyanosis or edema. Lymphatics: see Neck Exam Musculoskeletal: symmetric strength and muscle tone throughout. Neurologic: No obvious focalities. Speech is fluent.  Psychiatric: Judgment and insight are intact. Affect is appropriate. Breast exam reveals ***  Lab Findings: Lab Results  Component Value Date   WBC 5.2  11/15/2022   HGB 14.2 11/15/2022   HCT 41.0 11/15/2022   MCV 90 11/15/2022   PLT 217 11/15/2022    @LASTCHEMISTRY @  Radiographic Findings: No results found.  Impression/Plan: We discussed adjuvant radiotherapy today.  I recommend *** in order to ***.  I reviewed the logistics, benefits, risks, and potential side effects of this treatment in detail. Risks may include but not necessary be limited to acute and late injury tissue in the radiation fields such as skin irritation (change in color/pigmentation, itching, dryness, pain, peeling). She may experience fatigue. We also discussed possible risk of long term cosmetic changes or scar tissue. There is also a smaller risk for lung toxicity, ***cardiac toxicity, ***brachial plexopathy, ***lymphedema, ***musculoskeletal changes, ***rib fragility or ***induction of a second malignancy, ***late chronic non-healing soft tissue wound.    The patient asked good questions which I answered to her satisfaction. She is enthusiastic about proceeding with treatment. A consent form has been *** signed and placed in her chart.  A total of *** medically necessary complex treatment devices will be fabricated and supervised by me: *** fields with MLCs for custom blocks to protect heart, and lungs;  and, a Vac-lok. MORE COMPLEX DEVICES MAY BE MADE IN DOSIMETRY FOR FIELD IN FIELD BEAMS FOR DOSE HOMOGENEITY.  I have requested : 3D Simulation which is medically necessary to give adequate dose to at risk tissues while sparing lungs and heart.  I have requested a DVH of the following structures: lungs, heart, *** lumpectomy cavity.    The patient will receive *** Gy in *** fractions to the *** with *** fields.  This will be *** followed by a boost.  On date of service, in total, I spent *** minutes on this encounter. Patient was seen in person.  _____________________________________   Lauraine Golden, MD  This document serves as a record of services personally  performed by Lauraine Golden, MD. It was created on her behalf by Dorthy Fuse, a trained medical scribe. The creation of this record is based on the scribe's personal observations and the provider's statements to them. This document has been checked and approved by the attending provider.

## 2024-01-29 ENCOUNTER — Ambulatory Visit
Admission: RE | Admit: 2024-01-29 | Discharge: 2024-01-29 | Disposition: A | Discharge status: Home / Self Care | Source: Ambulatory Visit | Attending: Radiation Oncology | Admitting: Radiation Oncology

## 2024-01-29 VITALS — BP 138/82 | HR 66 | Temp 97.3°F | Resp 18 | Ht 63.0 in | Wt 139.1 lb

## 2024-01-29 DIAGNOSIS — Z170 Estrogen receptor positive status [ER+]: Secondary | ICD-10-CM

## 2024-01-29 DIAGNOSIS — C50412 Malignant neoplasm of upper-outer quadrant of left female breast: Secondary | ICD-10-CM | POA: Diagnosis not present

## 2024-01-30 ENCOUNTER — Ambulatory Visit: Admitting: Radiation Oncology

## 2024-01-31 ENCOUNTER — Encounter: Payer: Self-pay | Admitting: *Deleted

## 2024-02-05 ENCOUNTER — Inpatient Hospital Stay: Admitting: Hematology and Oncology

## 2024-02-06 ENCOUNTER — Encounter: Payer: Self-pay | Admitting: *Deleted

## 2024-02-06 ENCOUNTER — Ambulatory Visit: Admitting: Radiation Oncology

## 2024-02-07 ENCOUNTER — Telehealth: Payer: Self-pay | Admitting: Hematology and Oncology

## 2024-02-07 ENCOUNTER — Encounter: Payer: Self-pay | Admitting: *Deleted

## 2024-02-15 ENCOUNTER — Encounter: Payer: Self-pay | Admitting: *Deleted

## 2024-02-15 DIAGNOSIS — C50412 Malignant neoplasm of upper-outer quadrant of left female breast: Secondary | ICD-10-CM

## 2024-02-16 DIAGNOSIS — D2371 Other benign neoplasm of skin of right lower limb, including hip: Secondary | ICD-10-CM | POA: Diagnosis not present

## 2024-02-16 DIAGNOSIS — L814 Other melanin hyperpigmentation: Secondary | ICD-10-CM | POA: Diagnosis not present

## 2024-02-16 DIAGNOSIS — L821 Other seborrheic keratosis: Secondary | ICD-10-CM | POA: Diagnosis not present

## 2024-02-16 DIAGNOSIS — Z86018 Personal history of other benign neoplasm: Secondary | ICD-10-CM | POA: Diagnosis not present

## 2024-02-16 DIAGNOSIS — D485 Neoplasm of uncertain behavior of skin: Secondary | ICD-10-CM | POA: Diagnosis not present

## 2024-02-16 DIAGNOSIS — Z85828 Personal history of other malignant neoplasm of skin: Secondary | ICD-10-CM | POA: Diagnosis not present

## 2024-03-06 NOTE — Telephone Encounter (Signed)
 SABRA

## 2024-03-18 ENCOUNTER — Other Ambulatory Visit: Payer: Self-pay | Admitting: Endocrinology

## 2024-03-18 DIAGNOSIS — E049 Nontoxic goiter, unspecified: Secondary | ICD-10-CM

## 2024-04-12 DIAGNOSIS — E039 Hypothyroidism, unspecified: Secondary | ICD-10-CM | POA: Diagnosis not present

## 2024-04-19 ENCOUNTER — Other Ambulatory Visit: Payer: Self-pay | Admitting: Endocrinology

## 2024-04-19 ENCOUNTER — Other Ambulatory Visit (HOSPITAL_BASED_OUTPATIENT_CLINIC_OR_DEPARTMENT_OTHER): Payer: Self-pay | Admitting: Endocrinology

## 2024-04-19 DIAGNOSIS — E039 Hypothyroidism, unspecified: Secondary | ICD-10-CM | POA: Diagnosis not present

## 2024-04-19 DIAGNOSIS — E049 Nontoxic goiter, unspecified: Secondary | ICD-10-CM

## 2024-04-19 DIAGNOSIS — E7800 Pure hypercholesterolemia, unspecified: Secondary | ICD-10-CM | POA: Diagnosis not present

## 2024-04-19 DIAGNOSIS — E1165 Type 2 diabetes mellitus with hyperglycemia: Secondary | ICD-10-CM | POA: Diagnosis not present

## 2024-05-10 ENCOUNTER — Inpatient Hospital Stay: Admitting: Adult Health

## 2024-07-26 ENCOUNTER — Telehealth: Payer: Self-pay

## 2024-07-26 NOTE — Progress Notes (Unsigned)
 Complex Care Management Note  Care Guide Note 07/26/2024 Name: Laura Marshall MRN: [id number] DOB: 04-05-1966  Laura Marshall is a 59 y.o. year old female who sees Waddell Rake, MD for primary care. I reached out to Laura Marshall by phone today to offer complex care management services.  [name] was given information about Complex Care Management services today including:   The Complex Care Management services include support from the care team which includes your Nurse Care Manager, Clinical Social Worker, or Pharmacist.  The Complex Care Management team is here to help remove barriers to the health concerns and goals most important to you. Complex Care Management services are voluntary, and the patient may decline or stop services at any time by request to their care team member.   Complex Care Management Consent Status: Patient wishes to consider information provided and/or speak with a member of the care team before deciding to participate in complex care management services.   Follow up plan:  The care guide will reach out to the patient again over the next 7 days.  Encounter Outcome:  Patient Request to Call Back  Debbe Fuse [facility]  [facility] Guide  Direct Dial: [phone]  Fax [phone]
